# Patient Record
Sex: Male | Born: 1970 | Race: White | Hispanic: No | Marital: Single | Smoking: Never smoker
Health system: Southern US, Community
[De-identification: ages and names within clinical notes are randomized; demographics above are authoritative.]

## PROBLEM LIST (undated history)

## (undated) DIAGNOSIS — H9190 Unspecified hearing loss, unspecified ear: Secondary | ICD-10-CM

## (undated) HISTORY — PX: APPENDECTOMY: SHX54

## (undated) HISTORY — PX: OTHER SURGICAL HISTORY: SHX169

---

## 2000-12-17 ENCOUNTER — Emergency Department (HOSPITAL_COMMUNITY): Admission: EM | Admit: 2000-12-17 | Discharge: 2000-12-18 | Payer: Self-pay | Admitting: Internal Medicine

## 2018-10-30 ENCOUNTER — Emergency Department (HOSPITAL_COMMUNITY): Admission: EM | Admit: 2018-10-30 | Discharge: 2018-10-30 | Discharge status: Absent without leave | Payer: Self-pay

## 2018-10-30 ENCOUNTER — Encounter (HOSPITAL_COMMUNITY): Payer: Self-pay | Admitting: Nurse Practitioner

## 2018-10-30 ENCOUNTER — Emergency Department (HOSPITAL_COMMUNITY)
Admission: EM | Admit: 2018-10-30 | Discharge: 2018-10-30 | Disposition: A | Discharge status: Other Acute Inpatient Hospital | Payer: Self-pay | Attending: Emergency Medicine | Admitting: Emergency Medicine

## 2018-10-30 DIAGNOSIS — F332 Major depressive disorder, recurrent severe without psychotic features: Secondary | ICD-10-CM | POA: Insufficient documentation

## 2018-10-30 DIAGNOSIS — F17210 Nicotine dependence, cigarettes, uncomplicated: Secondary | ICD-10-CM | POA: Insufficient documentation

## 2018-10-30 DIAGNOSIS — R45851 Suicidal ideations: Secondary | ICD-10-CM | POA: Insufficient documentation

## 2018-10-30 LAB — CBC
HCT: 46.2 % (ref 39.0–52.0)
Hemoglobin: 15.9 g/dL (ref 13.0–17.0)
MCH: 32.3 pg (ref 26.0–34.0)
MCHC: 34.4 g/dL (ref 30.0–36.0)
MCV: 93.7 fL (ref 80.0–100.0)
Platelets: 300 10*3/uL (ref 150–400)
RBC: 4.93 MIL/uL (ref 4.22–5.81)
RDW: 11.7 % (ref 11.5–15.5)
WBC: 6.3 10*3/uL (ref 4.0–10.5)
nRBC: 0 % (ref 0.0–0.2)

## 2018-10-30 LAB — COMPREHENSIVE METABOLIC PANEL
ALT: 42 U/L (ref 0–44)
AST: 32 U/L (ref 15–41)
Albumin: 4.4 g/dL (ref 3.5–5.0)
Alkaline Phosphatase: 85 U/L (ref 38–126)
Anion gap: 8 (ref 5–15)
BUN: 10 mg/dL (ref 6–20)
CO2: 22 mmol/L (ref 22–32)
Calcium: 8.7 mg/dL — ABNORMAL LOW (ref 8.9–10.3)
Chloride: 106 mmol/L (ref 98–111)
Creatinine, Ser: 0.91 mg/dL (ref 0.61–1.24)
GFR calc Af Amer: >60 mL/min (ref >60)
GFR calc non Af Amer: >60 mL/min (ref >60)
Glucose, Bld: 110 mg/dL — ABNORMAL HIGH (ref 70–99)
Potassium: 4 mmol/L (ref 3.5–5.1)
Sodium: 136 mmol/L (ref 135–145)
Total Bilirubin: 0.7 mg/dL (ref 0.3–1.2)
Total Protein: 7.6 g/dL (ref 6.5–8.1)

## 2018-10-30 LAB — ETHANOL: Alcohol, Ethyl (B): 67 mg/dL — ABNORMAL HIGH (ref <10)

## 2018-10-30 LAB — RAPID URINE DRUG SCREEN, HOSP PERFORMED
Amphetamines: NOT DETECTED
Barbiturates: NOT DETECTED
Benzodiazepines: NOT DETECTED
Cocaine: NOT DETECTED
Opiates: NOT DETECTED
Tetrahydrocannabinol: NOT DETECTED

## 2018-10-30 LAB — SALICYLATE LEVEL: Salicylate Lvl: <7 mg/dL (ref 2.8–30.0)

## 2018-10-30 LAB — ACETAMINOPHEN LEVEL: Acetaminophen (Tylenol), Serum: <10 ug/mL — ABNORMAL LOW (ref 10–30)

## 2018-10-30 MED ORDER — ACETAMINOPHEN 325 MG PO TABS: 650.0000 mg | ORAL_TABLET | ORAL | Status: DC | PRN

## 2018-10-30 MED ORDER — ALUM & MAG HYDROXIDE-SIMETH 200-200-20 MG/5ML PO SUSP: 30.0000 mL | Freq: Four times a day (QID) | ORAL | Status: DC | PRN

## 2018-10-30 MED ORDER — NICOTINE 21 MG/24HR TD PT24: 21.0000 mg | MEDICATED_PATCH | Freq: Every day | TRANSDERMAL | Status: DC

## 2018-10-30 MED ORDER — ONDANSETRON HCL 4 MG PO TABS: 4.0000 mg | ORAL_TABLET | Freq: Three times a day (TID) | ORAL | Status: DC | PRN

## 2018-10-30 NOTE — ED Notes (Signed)
Report called to [facility] to Gentry Roch RN. [facility] [employer] called for transport.

## 2018-10-30 NOTE — BH Assessment (Addendum)
Assessment Note  Eric Nunez is an 47 y.o. male who presents to the ED voluntarily. Per chart review, pt was found by [employer] with a loaded gun and making threats to kill himself. TTS spoke with [employer] officers who remained at bedside and was advised the pt told officers he put the gun to his head several times and thought about pulling the trigger. Pt reported to TTS he was going to kill himself with the gun, but he could not go through with it. Pt denies any specific triggers causing him to feel suicidal. Pt states he lives with a roommate who is often loud and obnoxious in the home. Pt states he has felt suicidal for most of his life. Pt denies any recent changes or life stressors. Pt states he was sitting at home and thought to himself "today is a good day to die" prompting him to get his gun. Pt states he does not have an OPT provider because he was abruptly dropped by his current provider, [facility], 2 years ago without cause. Pt states he does not know why his previous OPT provider refused to continue treating him. Pt states he has not seen any providers in the past 2 years. TTS asked the pt if he has ever experienced trauma including physical or sexual abuse and pt stated "no, but I wish." TTS asked the pt to explain what he meant and he stated "it would be good if someone abused me because I deserve to feel pain." Pt denies HI, AVH and SA hx. Pt is unable to contract for safety at this time and will require inpt hospitalization.   Pt is recommended for inpt treatment per Donell Sievert, PA. ED staff has been advised. [facility] at capacity per [facility]. TTS to seek placement.   Diagnosis: MDD, recurrent, severe, w/o psychosis  Past Medical History: History reviewed. No pertinent past medical history.  History reviewed. No pertinent surgical history.  Family History: No family history on file.  Social History:  reports that he has been smoking. He does not have any smokeless tobacco history on  file. His alcohol and drug histories are not on file.  Additional Social History:  Alcohol / Drug Use Pain Medications: See MAR Prescriptions: See MAR Over the Counter: See MAR History of alcohol / drug use?: No history of alcohol / drug abuse  CIWA: CIWA-Ar BP: (!) 131/94 Pulse Rate: 69 COWS:    Allergies: No Known Allergies  Home Medications:  (Not in a hospital admission)  OB/GYN Status:  No LMP for male patient.  General Assessment Data Location of Assessment: [facility] TTS Assessment: In system Is this a Tele or Face-to-Face Assessment?: Face-to-Face Is this an Initial Assessment or a Re-assessment for this encounter?: Initial Assessment Patient Accompanied by:: Other([employer]) Language Other than English: No What gender do you identify as?: Male Marital status: Single Pregnancy Status: No Living Arrangements: Non-relatives/Friends Can pt return to current living arrangement?: Yes Admission Status: Voluntary Is patient capable of signing voluntary admission?: Yes Referral Source: Self/Family/Friend Insurance type: none     Crisis Care Plan Living Arrangements: Non-relatives/Friends Name of Psychiatrist: none Name of Therapist: none  Education Status Is patient currently in school?: No Is the patient employed, unemployed or receiving disability?: Employed  Risk to self with the past 6 months Suicidal Ideation: Yes-Currently Present Has patient been a risk to self within the past 6 months prior to admission? : Yes Suicidal Intent: Yes-Currently Present Has patient had any suicidal  intent within the past 6 months prior to admission? : Yes Is patient at risk for suicide?: Yes Suicidal Plan?: Yes-Currently Present Has patient had any suicidal plan within the past 6 months prior to admission? : Yes Specify Current Suicidal Plan: pt made threats to shoot himself  Access to Means: Yes Specify Access to Suicidal Means: pt has gun  What has been your use of drugs/alcohol  within the last 12 months?: denies use  Previous Attempts/Gestures: No Triggers for Past Attempts: None known Intentional Self Injurious Behavior: None Family Suicide History: No Recent stressful life event(s): Other (Comment)(pt denies stressors) Persecutory voices/beliefs?: No Depression: Yes Depression Symptoms: Despondent, Insomnia, Isolating, Fatigue, Loss of interest in usual pleasures, Feeling worthless/self pity Substance abuse history and/or treatment for substance abuse?: No Suicide prevention information given to non-admitted patients: Not applicable  Risk to Others within the past 6 months Homicidal Ideation: No Does patient have any lifetime risk of violence toward others beyond the six months prior to admission? : No Thoughts of Harm to Others: No Current Homicidal Intent: No Current Homicidal Plan: No Access to Homicidal Means: No History of harm to others?: No Assessment of Violence: None Noted Does patient have access to weapons?: Yes (Comment)(gun) Criminal Charges Pending?: No Does patient have a court date: No Is patient on probation?: No  Psychosis Hallucinations: None noted Delusions: None noted  Mental Status Report Appearance/Hygiene: In scrubs, Unremarkable Eye Contact: Good Motor Activity: Freedom of movement Speech: Logical/coherent Level of Consciousness: Alert Mood: Depressed, Despair Affect: Depressed, Flat Anxiety Level: None Thought Processes: Relevant, Coherent Judgement: Impaired Orientation: Person, Place, Time, Situation, Appropriate for developmental age Obsessive Compulsive Thoughts/Behaviors: None  Cognitive Functioning Concentration: Normal Memory: Remote Intact, Recent Intact Is patient IDD: No Insight: Poor Impulse Control: Poor Appetite: Good Have you had any weight changes? : No Change Sleep: Decreased Total Hours of Sleep: 4 Vegetative Symptoms: None  ADLScreening [facility] Assessment Services) Patient's cognitive  ability adequate to safely complete daily activities?: Yes Patient able to express need for assistance with ADLs?: Yes Independently performs ADLs?: Yes (appropriate for developmental age)  Prior Inpatient Therapy Prior Inpatient Therapy: Yes Prior Therapy Dates: unknown Prior Therapy Facilty/Provider(s): [facility]  Reason for Treatment: Depression  Prior Outpatient Therapy Prior Outpatient Therapy: Yes Prior Therapy Dates: 2017 Prior Therapy Facilty/Provider(s): [facility] Reason for Treatment: Depression Does patient have an ACCT team?: No Does patient have Intensive In-House Services?  : No Does patient have [facility] services? : No Does patient have P4CC services?: No  ADL Screening (condition at time of admission) Patient's cognitive ability adequate to safely complete daily activities?: Yes Is the patient deaf or have difficulty hearing?: No Does the patient have difficulty seeing, even when wearing glasses/contacts?: No Does the patient have difficulty concentrating, remembering, or making decisions?: No Patient able to express need for assistance with ADLs?: Yes Does the patient have difficulty dressing or bathing?: No Independently performs ADLs?: Yes (appropriate for developmental age) Does the patient have difficulty walking or climbing stairs?: No Weakness of Legs: None Weakness of Arms/Hands: None  Home Assistive Devices/Equipment Home Assistive Devices/Equipment: None    Abuse/Neglect Assessment (Assessment to be complete while patient is alone) Abuse/Neglect Assessment Can Be Completed: Yes Physical Abuse: Denies Verbal Abuse: Denies Sexual Abuse: Denies Exploitation of patient/patient's resources: Denies Self-Neglect: Denies     Merchant navy officer (For Healthcare) Does Patient Have a Medical Advance Directive?: No Would patient like information on creating a medical advance directive?: No -  Patient declined           Disposition: Pt is recommended for inpt treatment per Donell Sievert, PA. ED staff has been advised. [facility] at capacity per [facility]. TTS to seek placement.  Disposition Initial Assessment Completed for this Encounter: Yes Disposition of Patient: Admit Type of inpatient treatment program: Adult(per Donell Sievert, PA) Patient refused recommended treatment: No  On Site Evaluation by:   Reviewed with Physician:    Karolee Ohs 10/30/2018 2:31 AM

## 2018-10-30 NOTE — ED Triage Notes (Signed)
Pt is presented by [employer] voluntarily, reportedly called after he attempted to kill himself with a gun. Officers report finding a gun loaded with ammunition on pt's residency when they answered the call. Pt continues to report suicidal thoughts.

## 2018-10-30 NOTE — ED Notes (Signed)
Bed: WTR5 Expected date:  Expected time:  Means of arrival:  Comments: [employer] Mental Health/triage

## 2018-10-30 NOTE — Progress Notes (Addendum)
Patient is voluntarily and has been offered a bed at [facility].  Accepting Provider: Dr. Freda Jackson  RN Call for Report: [phone]   Patient agreed to go voluntarily to [facility]. Patient will need [place] transportation.   Enid Cutter, LCSW-A Clinical Social Worker [phone]

## 2018-10-30 NOTE — Progress Notes (Signed)
This patient continues to meet inpatient criteria. CSW faxed information to the following facilities:   [place] [facility] [place] [facility] [place] [facility]  Enid Cutter, [place] Clinical Social Worker [phone]

## 2018-10-30 NOTE — Progress Notes (Signed)
Pt is recommended for inpt treatment per Donell Sievert, PA. ED staff has been advised. [facility] at capacity per [facility]. TTS to seek placement.   Princess Bruins, MSW, LCSW Therapeutic Triage Specialist  [phone]

## 2018-10-30 NOTE — ED Provider Notes (Signed)
[facility]-EMERGENCY DEPT Provider Note   CSN: [id number] Arrival date & time: 10/30/18  0117     History   Chief Complaint Chief Complaint  Patient presents with  . Suicide Attempt    HPI Eric Nunez is a 47 y.o. male.  The history is provided by the patient.  He presents with complaints of suicidal thoughts.  He states he has had suicidal thoughts all his life.  He had thought he would shoot himself and he does have a gun with ammunition.  He denies prior suicide attempts.  He denies any homicidal thoughts.  He has had crying spells but no early morning awakening or anhedonia.  He denies hallucinations and denies other drug use.  History reviewed. No pertinent past medical history.  There are no active problems to display for this patient.   History reviewed. No pertinent surgical history.      Home Medications    Prior to Admission medications   Not on File    Family History No family history on file.  Social History Social History   Tobacco Use  . Smoking status: Current Some Day Smoker  Substance Use Topics  . Alcohol use: Not on file  . Drug use: Not on file     Allergies   Patient has no known allergies.   Review of Systems Review of Systems  All other systems reviewed and are negative.    Physical Exam Updated Vital Signs BP (!) 131/94 (BP Location: Right Arm)   Pulse 69   Temp 98 F (36.7 C) (Oral)   Resp 18   Ht 5\' 9"  (1.753 m)   Wt 99.8 kg   SpO2 98%   BMI 32.49 kg/m   Physical Exam  Nursing note and vitals reviewed.  47 year old male, resting comfortably and in no acute distress. Vital signs are significant for mild elevation of diastolic blood pressure. Oxygen saturation is 98%, which is normal. Head is normocephalic and atraumatic. PERRLA, EOMI. Oropharynx is clear. Neck is nontender and supple without adenopathy or JVD. Back is nontender and there is no CVA tenderness. Lungs are clear without rales,  wheezes, or rhonchi. Chest is nontender. Heart has regular rate and rhythm without murmur. Abdomen is soft, flat, nontender without masses or hepatosplenomegaly and peristalsis is normoactive. Extremities have no cyanosis or edema, full range of motion is present. Skin is warm and dry without rash. Neurologic: Depressed affect, normal speech, oriented x4, cranial nerves are intact, there are no motor or sensory deficits.  ED Treatments / Results  Labs (all labs ordered are listed, but only abnormal results are displayed) Labs Reviewed  COMPREHENSIVE METABOLIC PANEL - Abnormal; Notable for the following components:      Result Value   Glucose, Bld 110 (*)    Calcium 8.7 (*)    All other components within normal limits  ETHANOL - Abnormal; Notable for the following components:   Alcohol, Ethyl (B) 67 (*)    All other components within normal limits  ACETAMINOPHEN LEVEL - Abnormal; Notable for the following components:   Acetaminophen (Tylenol), Serum <10 (*)    All other components within normal limits  SALICYLATE LEVEL  CBC  RAPID URINE DRUG SCREEN, HOSP PERFORMED    Procedures Procedures   Medications Ordered in ED Medications  nicotine (NICODERM CQ - dosed in mg/24 hours) patch 21 mg (has no administration in time range)  alum & mag hydroxide-simeth (MAALOX/MYLANTA) 200-200-20 MG/5ML suspension 30 mL (has no administration  in time range)  ondansetron (ZOFRAN) tablet 4 mg (has no administration in time range)  acetaminophen (TYLENOL) tablet 650 mg (has no administration in time range)     Initial Impression / Assessment and Plan / ED Course  I have reviewed the triage vital signs and the nursing notes.  Pertinent labs & imaging results that were available during my care of the patient were reviewed by me and considered in my medical decision making (see chart for details).  Suicidal ideation.  He has no prior records in the [facility] health system.  TTS evaluation will be  requested.  Screening labs are obtained.  Screening labs are unremarkable.  TTS consultation is appreciated.  Patient meets inpatient criteria.  Awaiting appropriate placement.  Final Clinical Impressions(s) / ED Diagnoses   Final diagnoses:  Suicidal ideation  Severe episode of recurrent major depressive disorder, without psychotic features [facility])    ED Discharge Orders    None       Dione Booze, MD 10/30/18 [phone]

## 2018-10-30 NOTE — ED Notes (Signed)
Patient reports SI wit a plan to shoot himself. Patient denies HI/AVH. Patient contracts for safety while on unit. Plan of care discussed. Encouragement and support provided and safety maintain. Q 15 min safety checks in place and video monitoring.

## 2018-10-30 NOTE — ED Notes (Signed)
Bed: WBH41 Expected date:  Expected time:  Means of arrival:  Comments: Triage 5 

## 2018-10-30 NOTE — ED Notes (Signed)
Reports loaning a friend $1200 that did not actually belong to him, and now she has disappeared. It was to be used to fund some event and he was to provide entertainment. He is distraught with thoughts to kill himself. Reports a long-term mental health history. STays in bed calm and cooperative.

## 2020-03-22 DIAGNOSIS — E7800 Pure hypercholesterolemia, unspecified: Secondary | ICD-10-CM

## 2020-03-22 DIAGNOSIS — M25511 Pain in right shoulder: Secondary | ICD-10-CM

## 2020-03-22 DIAGNOSIS — R7989 Other specified abnormal findings of blood chemistry: Secondary | ICD-10-CM

## 2020-03-22 HISTORY — DX: Pure hypercholesterolemia, unspecified: E78.00

## 2020-03-22 HISTORY — DX: Pain in right shoulder: M25.511

## 2020-03-22 HISTORY — DX: Other specified abnormal findings of blood chemistry: R79.89

## 2020-03-26 ENCOUNTER — Ambulatory Visit: Payer: Self-pay | Admitting: Physician Assistant

## 2020-03-26 ENCOUNTER — Other Ambulatory Visit: Payer: Self-pay

## 2020-03-26 VITALS — BP 134/95 | HR 66 | Temp 98.6°F | Resp 18 | Ht 69.0 in | Wt 221.0 lb

## 2020-03-26 DIAGNOSIS — R5383 Other fatigue: Secondary | ICD-10-CM

## 2020-03-26 DIAGNOSIS — M542 Cervicalgia: Secondary | ICD-10-CM

## 2020-03-26 DIAGNOSIS — Z13220 Encounter for screening for lipoid disorders: Secondary | ICD-10-CM

## 2020-03-26 DIAGNOSIS — Z125 Encounter for screening for malignant neoplasm of prostate: Secondary | ICD-10-CM

## 2020-03-26 DIAGNOSIS — M25511 Pain in right shoulder: Secondary | ICD-10-CM

## 2020-03-26 MED ORDER — CYCLOBENZAPRINE HCL 10 MG PO TABS: 10.0000 mg | ORAL_TABLET | Freq: Three times a day (TID) | ORAL | 0 refills | Status: DC | PRN

## 2020-03-26 MED ORDER — IBUPROFEN 600 MG PO TABS: 600.0000 mg | ORAL_TABLET | Freq: Three times a day (TID) | ORAL | 0 refills | Status: DC | PRN

## 2020-03-26 NOTE — Progress Notes (Signed)
New Patient Office Visit  Subjective:  Patient ID: Eric Nunez, male    DOB: 03-15-1971  Age: 49 y.o. MRN: [id number]  CC:  Chief Complaint  Patient presents with  . Arm Pain    HPI Eric Nunez presents for neck pain  Reports that he has been having neck pain, right shoulder pain and right upper arm pain for the past year.  Reports that he is not sure if he "slept on his neck wrong", but states that it started without injury or trauma.  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  Reports that he has been feeling spasms and a knot near his right shoulder blade area.  Reports that he has tried stretching, heating pad, ice without much relief.  Reports he does not like taking over-the-counter pain medication.  Reports that he had a massage 2 weeks ago, feels that it did not offer relief.  Reports that he has difficulty sleeping due to the pain.  States that he does spend most of his time in one room due to his living conditions, his bed is where he watches TV and where he eats.  Reports that he does not feel he has good posture while he is watching TV.  Reports healthy diet, takes multivitamins, vitamin D, B12, eats a vegan diet, drinks 64 to 80 ounces of water a day.  Reports despite his healthy diet, he has been having extra fatigue, does endorse difficulty sleeping to the pain, increased stressors.  Has not tried anything for relief, does not like taking over-the-counter sleep medications.       History reviewed. No pertinent past medical history.  History reviewed. No pertinent surgical history.  History reviewed. No pertinent family history.  Social History   Socioeconomic History  . Marital status: Single    Spouse name: Not on file  . Number of children: Not on file  . Years of  education: Not on file  . Highest education level: Not on file  Occupational History  . Not on file  Tobacco Use  . Smoking status: Current Some Day Smoker  . Smokeless tobacco: Never Used  Substance and Sexual Activity  . Alcohol use: Not Currently  . Drug use: Never  . Sexual activity: Not on file  Other Topics Concern  . Not on file  Social History Narrative  . Not on file   Social Determinants of Health   Financial Resource Strain:   . Difficulty of Paying Living Expenses:   Food Insecurity:   . Worried About Programme researcher, broadcasting/film/video in the Last Year:   . Barista in the Last Year:   Transportation Needs:   . Freight forwarder (Medical):   Marland Kitchen Lack of Transportation (Non-Medical):   Physical Activity:   . Days of Exercise per Week:   . Minutes of Exercise per Session:   Stress:   . Feeling of Stress :   Social Connections:   . Frequency of Communication with Friends and Family:   . Frequency of Social Gatherings with Friends and Family:   . Attends Religious Services:   . Active Member of Clubs or Organizations:   . Attends Banker Meetings:   Marland Kitchen Marital Status:   Intimate Partner Violence:   .  Fear of Current or Ex-Partner:   . Emotionally Abused:   Marland Kitchen Physically Abused:   . Sexually Abused:     ROS Review of Systems  Constitutional: Positive for fatigue.  HENT: Negative.   Eyes: Negative.   Respiratory: Negative.   Cardiovascular: Negative.   Gastrointestinal: Negative.   Endocrine: Negative.   Genitourinary: Negative.   Musculoskeletal: Positive for arthralgias, back pain, myalgias and neck pain. Negative for neck stiffness.  Skin: Negative.   Allergic/Immunologic: Negative.   Hematological: Negative.   Psychiatric/Behavioral: Positive for sleep disturbance. Negative for suicidal ideas.    Objective:   Today's Vitals: Resp 18   Ht 5\' 9"  (1.753 m)   Wt 221 lb (100.2 kg)   BMI 32.64 kg/m   Physical Exam Vitals and nursing  note reviewed.  Constitutional:      Appearance: Normal appearance. He is obese.  HENT:     Head: Normocephalic and atraumatic.     Right Ear: External ear normal.     Left Ear: External ear normal.     Nose: Nose normal.     Mouth/Throat:     Mouth: Mucous membranes are moist.     Pharynx: Oropharynx is clear.  Eyes:     Extraocular Movements: Extraocular movements intact.     Conjunctiva/sclera: Conjunctivae normal.     Pupils: Pupils are equal, round, and reactive to light.  Cardiovascular:     Rate and Rhythm: Normal rate and regular rhythm.     Pulses: Normal pulses.     Heart sounds: Normal heart sounds.  Pulmonary:     Effort: Pulmonary effort is normal.     Breath sounds: Normal breath sounds.  Abdominal:     General: Abdomen is flat. Bowel sounds are normal.     Palpations: Abdomen is soft.  Musculoskeletal:     Cervical back: No rigidity. Pain with movement and muscular tenderness present. Decreased range of motion.     Thoracic back: Tenderness present.     Lumbar back: Tenderness present. Decreased range of motion. Positive right straight leg raise test and positive left straight leg raise test.     Comments: Pain elicited with ROM testing  Skin:    General: Skin is warm and dry.  Neurological:     General: No focal deficit present.     Mental Status: He is alert and oriented to person, place, and time.  Psychiatric:        Mood and Affect: Mood normal.        Behavior: Behavior normal.        Thought Content: Thought content normal.        Judgment: Judgment normal.     Assessment & Plan:   Problem List Items Addressed This Visit    None      Outpatient Encounter Medications as of 03/26/2020  Medication Sig  . acetaminophen (TYLENOL) 500 MG tablet Take 500 mg by mouth every 6 (six) hours as needed for mild pain or moderate pain.  Marland Kitchen ibuprofen (ADVIL,MOTRIN) 200 MG tablet Take 200 mg by mouth every 6 (six) hours as needed for mild pain or moderate pain.   . Multiple Vitamin (MULTIVITAMIN WITH MINERALS) TABS tablet Take 1 tablet by mouth daily.   No facility-administered encounter medications on file as of 03/26/2020.  1. Pain in joint of right shoulder Patient does not have insurance, gave patient education on [employer], patient is given appointment to see and establish primary care at patient care on  April 23, will hold off on ordering x-rays due to patient request.  Gave patient education on continuing heat, stretching exercises twice a day, work on better posture while watching television in bed. - ibuprofen (ADVIL) 600 MG tablet; Take 1 tablet (600 mg total) by mouth every 8 (eight) hours as needed.  Dispense: 60 tablet; Refill: 0 - cyclobenzaprine (FLEXERIL) 10 MG tablet; Take 1 tablet (10 mg total) by mouth 3 (three) times daily as needed for muscle spasms.  Dispense: 30 tablet; Refill: 0  2. Neck pain, acute  - ibuprofen (ADVIL) 600 MG tablet; Take 1 tablet (600 mg total) by mouth every 8 (eight) hours as needed.  Dispense: 60 tablet; Refill: 0 - cyclobenzaprine (FLEXERIL) 10 MG tablet; Take 1 tablet (10 mg total) by mouth 3 (three) times daily as needed for muscle spasms.  Dispense: 30 tablet; Refill: 0  3. Screening for prostate cancer  - PSA  4. Screening for lipid disorders  - CBC with Differential/Platelet - Comp. Metabolic Panel (12) - Lipid panel  5. Other fatigue Gave patient education on good sleep hygiene. - TSH  I have reviewed the patient's medical history (PMH, PSH, Social History, Family History, Medications, and allergies) , and have been updated if relevant. I spent 30 minutes reviewing chart and  face to face time with patient.     Follow-up: No follow-ups on file.   Kasandra Knudsen Mayers, PA-C

## 2020-03-26 NOTE — Patient Instructions (Addendum)
I sent ibuprofen and flexeril July your neck pain, you may use them every 8 hours, continue with the heating pad and do these neck exercises twice a day, heating yur neck and shoulder muscles before and after.  We are making you an appointment to establish primary care, they will help you with financial assistance and further evaluation of your neck pain   Neck Exercises Ask your health care provider which exercises are safe for you. Do exercises exactly as told by your health care provider and adjust them as directed. It is normal to feel mild stretching, pulling, tightness, or discomfort as you do these exercises. Stop right away if you feel sudden pain or your pain gets worse. Do not begin these exercises until told by your health care provider. Neck exercises can be important for many reasons. They can improve strength and maintain flexibility in your neck, which will help your upper back and prevent neck pain. Stretching exercises Rotation neck stretching  1. Sit in a chair or stand up. 2. Place your feet flat on the floor, shoulder width apart. 3. Slowly turn your head (rotate) to the right until a slight stretch is felt. Turn it all the way to the right so you can look over your right shoulder. Do not tilt or tip your head. 4. Hold this position for 10-30 seconds. 5. Slowly turn your head (rotate) to the left until a slight stretch is felt. Turn it all the way to the left so you can look over your left shoulder. Do not tilt or tip your head. 6. Hold this position for 10-30 seconds. Repeat __________ times. Complete this exercise __________ times a day. Neck retraction 1. Sit in a sturdy chair or stand up. 2. Look straight ahead. Do not bend your neck. 3. Use your fingers to push your chin backward (retraction). Do not bend your neck for this movement. Continue to face straight ahead. If you are doing the exercise properly, you will feel a slight sensation in your throat and a stretch at  the back of your neck. 4. Hold the stretch for 1-2 seconds. Repeat __________ times. Complete this exercise __________ times a day. Strengthening exercises Neck press 1. Lie on your back on a firm bed or on the floor with a pillow under your head. 2. Use your neck muscles to push your head down on the pillow and straighten your spine. 3. Hold the position as well as you can. Keep your head facing up (in a neutral position) and your chin tucked. 4. Slowly count to 5 while holding this position. Repeat __________ times. Complete this exercise __________ times a day. Isometrics These are exercises in which you strengthen the muscles in your neck while keeping your neck still (isometrics). 1. Sit in a supportive chair and place your hand on your forehead. 2. Keep your head and face facing straight ahead. Do not flex or extend your neck while doing isometrics. 3. Push forward with your head and neck while pushing back with your hand. Hold for 10 seconds. 4. Do the sequence again, this time putting your hand against the back of your head. Use your head and neck to push backward against the hand pressure. 5. Finally, do the same exercise on either side of your head, pushing sideways against the pressure of your hand. Repeat __________ times. Complete this exercise __________ times a day. Prone head lifts 1. Lie face-down (prone position), resting on your elbows so that your chest and upper back  are raised. 2. Start with your head facing downward, near your chest. Position your chin either on or near your chest. 3. Slowly lift your head upward. Lift until you are looking straight ahead. Then continue lifting your head as far back as you can comfortably stretch. 4. Hold your head up for 5 seconds. Then slowly lower it to your starting position. Repeat __________ times. Complete this exercise __________ times a day. Supine head lifts 1. Lie on your back (supine position), bending your knees to point  to the ceiling and keeping your feet flat on the floor. 2. Lift your head slowly off the floor, raising your chin toward your chest. 3. Hold for 5 seconds. Repeat __________ times. Complete this exercise __________ times a day. Scapular retraction 1. Stand with your arms at your sides. Look straight ahead. 2. Slowly pull both shoulders (scapulae) backward and downward (retraction) until you feel a stretch between your shoulder blades in your upper back. 3. Hold for 10-30 seconds. 4. Relax and repeat. Repeat __________ times. Complete this exercise __________ times a day. Contact a health care provider if:  Your neck pain or discomfort gets much worse when you do an exercise.  Your neck pain or discomfort does not improve within 2 hours after you exercise. If you have any of these problems, stop exercising right away. Do not do the exercises again unless your health care provider says that you can. Get help right away if:  You develop sudden, severe neck pain. If this happens, stop exercising right away. Do not do the exercises again unless your health care provider says that you can. This information is not intended to replace advice given to you by your health care provider. Make sure you discuss any questions you have with your health care provider. Document Revised: 10/06/2018 Document Reviewed: 10/06/2018 Elsevier Patient Education  2020 [employer].

## 2020-03-26 NOTE — Progress Notes (Signed)
Patient complains of dull constant neck pain for the past month. Pain radiates into shoulder and arm with a throbbing pain. ROM is present. [phone]

## 2020-03-28 ENCOUNTER — Other Ambulatory Visit: Payer: Self-pay | Admitting: Physician Assistant

## 2020-03-28 ENCOUNTER — Telehealth: Payer: Self-pay | Admitting: *Deleted

## 2020-03-28 DIAGNOSIS — E7849 Other hyperlipidemia: Secondary | ICD-10-CM

## 2020-03-28 DIAGNOSIS — R7989 Other specified abnormal findings of blood chemistry: Secondary | ICD-10-CM

## 2020-03-28 LAB — COMP. METABOLIC PANEL (12)
AST: 26 IU/L (ref 0–40)
Albumin/Globulin Ratio: 1.7 (ref 1.2–2.2)
Albumin: 4.4 g/dL (ref 4.0–5.0)
Alkaline Phosphatase: 83 IU/L (ref 39–117)
BUN/Creatinine Ratio: 6 — ABNORMAL LOW (ref 9–20)
BUN: 6 mg/dL (ref 6–24)
Bilirubin Total: 0.6 mg/dL (ref 0.0–1.2)
Calcium: 9.1 mg/dL (ref 8.7–10.2)
Chloride: 108 mmol/L — ABNORMAL HIGH (ref 96–106)
Creatinine, Ser: 0.98 mg/dL (ref 0.76–1.27)
GFR calc Af Amer: 103 mL/min/{1.73_m2} (ref >59)
GFR calc non Af Amer: 90 mL/min/{1.73_m2} (ref >59)
Globulin, Total: 2.6 g/dL (ref 1.5–4.5)
Glucose: 86 mg/dL (ref 65–99)
Potassium: 4.4 mmol/L (ref 3.5–5.2)
Sodium: 143 mmol/L (ref 134–144)
Total Protein: 7 g/dL (ref 6.0–8.5)

## 2020-03-28 LAB — LIPID PANEL
Chol/HDL Ratio: 5.3 ratio — ABNORMAL HIGH (ref 0.0–5.0)
Cholesterol, Total: 211 mg/dL — ABNORMAL HIGH (ref 100–199)
HDL: 40 mg/dL (ref >39)
LDL Chol Calc (NIH): 143 mg/dL — ABNORMAL HIGH (ref 0–99)
Triglycerides: 153 mg/dL — ABNORMAL HIGH (ref 0–149)
VLDL Cholesterol Cal: 28 mg/dL (ref 5–40)

## 2020-03-28 LAB — CBC WITH DIFFERENTIAL/PLATELET
Basophils Absolute: 0 10*3/uL (ref 0.0–0.2)
Basos: 1 %
EOS (ABSOLUTE): 0.2 10*3/uL (ref 0.0–0.4)
Eos: 3 %
Hematocrit: 47.7 % (ref 37.5–51.0)
Hemoglobin: 16.7 g/dL (ref 13.0–17.7)
Immature Grans (Abs): 0 10*3/uL (ref 0.0–0.1)
Immature Granulocytes: 1 %
Lymphocytes Absolute: 2.2 10*3/uL (ref 0.7–3.1)
Lymphs: 35 %
MCH: 33.1 pg — ABNORMAL HIGH (ref 26.6–33.0)
MCHC: 35 g/dL (ref 31.5–35.7)
MCV: 95 fL (ref 79–97)
Monocytes Absolute: 0.3 10*3/uL (ref 0.1–0.9)
Monocytes: 5 %
Neutrophils Absolute: 3.6 10*3/uL (ref 1.4–7.0)
Neutrophils: 55 %
Platelets: 320 10*3/uL (ref 150–450)
RBC: 5.05 x10E6/uL (ref 4.14–5.80)
RDW: 12 % (ref 11.6–15.4)
WBC: 6.3 10*3/uL (ref 3.4–10.8)

## 2020-03-28 LAB — TSH: TSH: 7.24 u[IU]/mL — ABNORMAL HIGH (ref 0.450–4.500)

## 2020-03-28 LAB — PSA: Prostate Specific Ag, Serum: 0.6 ng/mL (ref 0.0–4.0)

## 2020-03-28 MED ORDER — ATORVASTATIN CALCIUM 10 MG PO TABS: 10.0000 mg | ORAL_TABLET | Freq: Every day | ORAL | 0 refills | Status: DC

## 2020-03-28 NOTE — Telephone Encounter (Signed)
-----   Message from Roney Jaffe, [place] sent at 03/28/2020 10:21 AM EDT ----- Please call patient and let him know that his cholesterol was elevated, he does have a higher risk of stroke or heart attack in the next 10 years because of this and his cigarette smoking.  It is recommended that he start cholesterol medication, I am going to send a cholesterol medication to his pharmacy, it is an affordable medication.   this type of drug is usually highly effective to lower LDL cholesterol and is usually very well tolerated. However, statin-type drugs can potentially injure the liver. Blood tests will be required at regular intervals for the duration of therapy.  Damage to the liver, if detected early, can be reversed by stopping the drug.  Be alert for persistent nausea, abdominal pain, or yellow jaundice. Statin drugs may also cause skeletal muscle injury in rare cases. Be alert for pronounced persistent diffuse muscle pain and discontinue the drug immediately should such symptoms develop. Grapefruit juice may increase the blood levels and side effects of some HMG Co-A reductase inhibitors .  I understand that he follows a vegan diet, he does however need to pay attention to a lower cholesterol diet as well and of course highly recommend working on smoking cessation.He also had an abnormal thyroid reading, it suggests that he has hypothyroidism.  I recommend that he have this lab repeated prior to treatment.  I will put a lab in and he can come to [facility] next week and have this lab repeated or he can have this repeated when he has his primary care visit at patient care.  The 10-year ASCVD risk score Denman George DC Montez Hageman., et al., 2013) is: 11.2%   Values used to calculate the score:     Age: 49 years     Sex: Male     Is Non-Hispanic African American: No     Diabetic: No     Tobacco smoker: Yes     Systolic Blood Pressure: 134 mmHg     Is BP treated: No     HDL Cholesterol: 40 mg/dL     Total  Cholesterol: 211 mg/dL

## 2020-03-28 NOTE — Telephone Encounter (Signed)
MA unable to reach patient due to mobile being out of service and work phone no answer. Communication letter will be mailed to confirmed address.

## 2020-04-04 ENCOUNTER — Telehealth: Payer: Self-pay | Admitting: Family Medicine

## 2020-04-04 NOTE — Telephone Encounter (Signed)
Tried calling  Pt to remind of appointment but numbers is not in service

## 2020-04-10 ENCOUNTER — Telehealth: Payer: Self-pay | Admitting: Family Medicine

## 2020-04-10 NOTE — Telephone Encounter (Signed)
Tried calling Pt to reschedule appointment for Fri 4/23 number is not valid

## 2020-04-13 ENCOUNTER — Ambulatory Visit: Payer: Self-pay | Admitting: Family Medicine

## 2020-04-16 ENCOUNTER — Ambulatory Visit (INDEPENDENT_AMBULATORY_CARE_PROVIDER_SITE_OTHER): Payer: Self-pay | Admitting: Family Medicine

## 2020-04-16 ENCOUNTER — Encounter: Payer: Self-pay | Admitting: Family Medicine

## 2020-04-16 ENCOUNTER — Ambulatory Visit (HOSPITAL_COMMUNITY)
Admission: RE | Admit: 2020-04-16 | Discharge: 2020-04-16 | Disposition: A | Discharge status: Home / Self Care | Payer: Self-pay | Source: Ambulatory Visit | Attending: Family Medicine | Admitting: Family Medicine

## 2020-04-16 ENCOUNTER — Other Ambulatory Visit: Payer: Self-pay

## 2020-04-16 DIAGNOSIS — Z09 Encounter for follow-up examination after completed treatment for conditions other than malignant neoplasm: Secondary | ICD-10-CM | POA: Insufficient documentation

## 2020-04-16 DIAGNOSIS — M79644 Pain in right finger(s): Secondary | ICD-10-CM | POA: Insufficient documentation

## 2020-04-16 DIAGNOSIS — Z7689 Persons encountering health services in other specified circumstances: Secondary | ICD-10-CM

## 2020-04-16 DIAGNOSIS — M25511 Pain in right shoulder: Secondary | ICD-10-CM | POA: Insufficient documentation

## 2020-04-16 NOTE — Progress Notes (Signed)
Virtual Visit via Telephone Note  I connected with Eric Nunez on 04/16/20 at  3:15 PM EDT by telephone and verified that I am speaking with the correct person using two identifiers.   I discussed the limitations, risks, security and privacy concerns of performing an evaluation and management service by telephone and the availability of in person appointments. I also discussed with the patient that there may be a patient responsible charge related to this service. The patient expressed understanding and agreed to proceed.   History of Present Illness:  Past Surgical History:  Procedure Laterality Date  . APPENDECTOMY    . left knee repair      Social History   Socioeconomic History  . Marital status: Single    Spouse name: Not on file  . Number of children: Not on file  . Years of education: Not on file  . Highest education level: Not on file  Occupational History  . Not on file  Tobacco Use  . Smoking status: Never Smoker  . Smokeless tobacco: Never Used  Substance and Sexual Activity  . Alcohol use: Yes    Comment: rare  . Drug use: Not Currently  . Sexual activity: Not Currently  Other Topics Concern  . Not on file  Social History Narrative  . Not on file   Social Determinants of Health   Financial Resource Strain:   . Difficulty of Paying Living Expenses:   Food Insecurity:   . Worried About Programme researcher, broadcasting/film/video in the Last Year:   . Barista in the Last Year:   Transportation Needs:   . Freight forwarder (Medical):   Marland Kitchen Lack of Transportation (Non-Medical):   Physical Activity:   . Days of Exercise per Week:   . Minutes of Exercise per Session:   Stress:   . Feeling of Stress :   Social Connections:   . Frequency of Communication with Friends and Family:   . Frequency of Social Gatherings with Friends and Family:   . Attends Religious Services:   . Active Member of Clubs or Organizations:   . Attends Banker Meetings:    Marland Kitchen Marital Status:   Intimate Partner Violence:   . Fear of Current or Ex-Partner:   . Emotionally Abused:   Marland Kitchen Physically Abused:   . Sexually Abused:     Past Medical History:  Diagnosis Date  . Elevated cholesterol 03/2020  . Elevated TSH 03/2020  . Right shoulder pain 03/2020    No Known Allergies   Past Medical History:  Diagnosis Date  . Elevated cholesterol 03/2020  . Elevated TSH 03/2020  . Right shoulder pain 03/2020    Patient Active Problem List   Diagnosis Date Noted  . Right shoulder pain 04/16/2020  . Thumb pain, right 04/16/2020  . Follow up 04/16/2020   Current Status: This will be Mr.Mcconville's initial Telephone Virtual visit with me. He was not previously seeing a physician for his PCP needs. Since his last office visit, he is doing well with no complaints. He has c/o right shoulder and right thumb pain X 1 month now. He is not taking anything for pain at this time. He denies fevers, chills, fatigue, recent infections, weight loss, and night sweats. He has not had any headaches, visual changes, dizziness, and falls. No chest pain, heart palpitations, cough and shortness of breath reported. No reports of GI problems such as nausea, vomiting, diarrhea, and constipation. He has no reports of  blood in stools, dysuria and hematuria. No depression or anxiety, and denies suicidal ideations, homicidal ideations, or auditory hallucinations. He is taking all medications as prescribed.    Observations/Objective: Telephone Virtual Visit   Assessment and Plan:  1. Right shoulder pain, unspecified chronicity - DG Shoulder Right  2. Encounter to establish care  3. Thumb pain, right   No orders of the defined types were placed in this encounter.   Orders Placed This Encounter  Procedures  . DG Shoulder Right   Referral Orders  No referral(s) requested today    Kathe Becton,  MSN, FNP-BC [facility] [address] [place], [place] [address] [phone]- fax    Follow Up Instructions:  He will follow up in 1 month for office visit and repeat TSH level.    I discussed the assessment and treatment plan with the patient. The patient was provided an opportunity to ask questions and all were answered. The patient agreed with the plan and demonstrated an understanding of the instructions.   The patient was advised to call back or seek an in-person evaluation if the symptoms worsen or if the condition fails to improve as anticipated.  I provided 20 minutes of non-face-to-face time during this encounter.   Azzie Glatter, FNP

## 2020-04-16 NOTE — Progress Notes (Deleted)
[facility]    New Patient--Establish Care   Subjective:  Patient ID: Eric Nunez, male    DOB: 06/11/1971  Age: 50 y.o. MRN: [id number]  CC:  Chief Complaint  Patient presents with  . New Patient (Initial Visit)    Est care  . Pain    pain in the right arm, right thumb, right side of neck     HPI Eric Nunez is a 49 year old male who presents to Establish Care today.   No past medical history on file.  Past Surgical History:  Procedure Laterality Date  . APPENDECTOMY    . left knee repair     Current Status: This will be *** initial office visit with me. *** was previously seeing *** for *** PCP needs. Since *** last office visit, *** is doing well with no complaints.     He has c/o right shoulder and right thumb pain X 1 month now. He is not taking anything for pain at this time.     *** denies fevers, chills, fatigue, recent infections, weight loss, and night sweats. *** has not had any headaches, visual changes, dizziness, and falls. No chest pain, heart palpitations, cough and shortness of breath reported. No reports of GI problems such as nausea, vomiting, diarrhea, and constipation. *** has no reports of blood in stools, dysuria and hematuria. No depression or anxiety, and denies suicidal ideations, homicidal ideations, or auditory hallucinations. *** is *** all medications as prescribed.   Family History  Problem Relation Age of Onset  . Diabetes Mother   . Diabetes Father     Social History   Socioeconomic History  . Marital status: Single    Spouse name: Not on file  . Number of children: Not on file  . Years of education: Not on file  . Highest education level: Not on file  Occupational History  . Not on file  Tobacco Use  . Smoking status: Never Smoker  . Smokeless tobacco: Never Used  Substance and Sexual Activity  . Alcohol use: Yes    Comment: rare  . Drug use: Not  Currently  . Sexual activity: Not Currently  Other Topics Concern  . Not on file  Social History Narrative  . Not on file   Social Determinants of Health   Financial Resource Strain:   . Difficulty of Paying Living Expenses:   Food Insecurity:   . Worried About Programme researcher, broadcasting/film/video in the Last Year:   . Barista in the Last Year:   Transportation Needs:   . Freight forwarder (Medical):   Marland Kitchen Lack of Transportation (Non-Medical):   Physical Activity:   . Days of Exercise per Week:   . Minutes of Exercise per Session:   Stress:   . Feeling of Stress :   Social Connections:   . Frequency of Communication with Friends and Family:   . Frequency of Social Gatherings with Friends and Family:   . Attends Religious Services:   . Active Member of Clubs or Organizations:   . Attends Banker Meetings:   Marland Kitchen Marital Status:   Intimate Partner Violence:   . Fear of Current or Ex-Partner:   . Emotionally Abused:   Marland Kitchen Physically Abused:   . Sexually Abused:     Outpatient Medications Prior to Visit  Medication Sig Dispense Refill  . ibuprofen (ADVIL) 600 MG tablet Take 1 tablet (600 mg  total) by mouth every 8 (eight) hours as needed. 60 tablet 0  . Multiple Vitamin (MULTIVITAMIN WITH MINERALS) TABS tablet Take 1 tablet by mouth daily.    Marland Kitchen acetaminophen (TYLENOL) 500 MG tablet Take 500 mg by mouth every 6 (six) hours as needed for mild pain or moderate pain.    Marland Kitchen atorvastatin (LIPITOR) 10 MG tablet Take 1 tablet (10 mg total) by mouth daily. (Patient not taking: Reported on 04/16/2020) 30 tablet 0  . cyclobenzaprine (FLEXERIL) 10 MG tablet Take 1 tablet (10 mg total) by mouth 3 (three) times daily as needed for muscle spasms. (Patient not taking: Reported on 04/16/2020) 30 tablet 0   No facility-administered medications prior to visit.    No Known Allergies  ROS Review of Systems    Objective:    Physical Exam  There were no vitals taken for this visit. Wt  Readings from Last 3 Encounters:  03/26/20 221 lb (100.2 kg)  10/30/18 220 lb (99.8 kg)     Health Maintenance Due  Topic Date Due  . HIV Screening  Never done  . COVID-19 Vaccine (1) Never done  . COLONOSCOPY  Never done    There are no preventive care reminders to display for this patient.  Lab Results  Component Value Date   TSH 7.240 (H) 03/26/2020   Lab Results  Component Value Date   WBC 6.3 03/26/2020   HGB 16.7 03/26/2020   HCT 47.7 03/26/2020   MCV 95 03/26/2020   PLT 320 03/26/2020   Lab Results  Component Value Date   NA 143 03/26/2020   K 4.4 03/26/2020   CO2 22 10/30/2018   GLUCOSE 86 03/26/2020   BUN 6 03/26/2020   CREATININE 0.98 03/26/2020   BILITOT 0.6 03/26/2020   ALKPHOS 83 03/26/2020   AST 26 03/26/2020   ALT 42 10/30/2018   PROT 7.0 03/26/2020   ALBUMIN 4.4 03/26/2020   CALCIUM 9.1 03/26/2020   ANIONGAP 8 10/30/2018   Lab Results  Component Value Date   CHOL 211 (H) 03/26/2020   Lab Results  Component Value Date   HDL 40 03/26/2020   Lab Results  Component Value Date   LDLCALC 143 (H) 03/26/2020   Lab Results  Component Value Date   TRIG 153 (H) 03/26/2020   Lab Results  Component Value Date   CHOLHDL 5.3 (H) 03/26/2020   No results found for: HGBA1C    Assessment & Plan:   Problem List Items Addressed This Visit    None      No orders of the defined types were placed in this encounter.   Follow-up: No follow-ups on file.    Azzie Glatter, FNP

## 2020-04-24 ENCOUNTER — Other Ambulatory Visit: Payer: Self-pay

## 2020-04-24 ENCOUNTER — Ambulatory Visit: Payer: Self-pay | Attending: Family Medicine

## 2020-04-30 ENCOUNTER — Telehealth: Payer: Self-pay | Admitting: Family Medicine

## 2020-04-30 NOTE — Telephone Encounter (Signed)
Message left for patient to contact office to review results of recent scan.

## 2020-05-14 ENCOUNTER — Ambulatory Visit: Payer: Self-pay | Admitting: Family Medicine

## 2020-05-25 ENCOUNTER — Ambulatory Visit: Payer: Self-pay | Admitting: Family Medicine

## 2020-06-20 ENCOUNTER — Ambulatory Visit: Payer: Self-pay | Attending: Family Medicine | Admitting: Nurse Practitioner

## 2020-06-20 ENCOUNTER — Encounter: Payer: Self-pay | Admitting: Nurse Practitioner

## 2020-06-20 DIAGNOSIS — Z7689 Persons encountering health services in other specified circumstances: Secondary | ICD-10-CM

## 2020-06-20 DIAGNOSIS — R7989 Other specified abnormal findings of blood chemistry: Secondary | ICD-10-CM

## 2020-06-20 DIAGNOSIS — M25511 Pain in right shoulder: Secondary | ICD-10-CM

## 2020-06-20 MED ORDER — NAPROXEN 500 MG PO TABS: 500.0000 mg | ORAL_TABLET | Freq: Two times a day (BID) | ORAL | 1 refills | Status: AC

## 2020-06-20 NOTE — Progress Notes (Signed)
Virtual Visit via Telephone Note Due to national recommendations of social distancing due to COVID 19, telehealth visit is felt to be most appropriate for this patient at this time.  I discussed the limitations, risks, security and privacy concerns of performing an evaluation and management service by telephone and the availability of in person appointments. I also discussed with the patient that there may be a patient responsible charge related to this service. The patient expressed understanding and agreed to proceed.    I connected with Aristidis Talerico Guilmette on 06/20/20  at   1:30 PM EDT  EDT by telephone and verified that I am speaking with the correct person using two identifiers.   Consent I discussed the limitations, risks, security and privacy concerns of performing an evaluation and management service by telephone and the availability of in person appointments. I also discussed with the patient that there may be a patient responsible charge related to this service. The patient expressed understanding and agreed to proceed.   Location of Patient: Private residence   Location of Provider: [facility]    Persons participating in Telemedicine visit: Bertram Denver FNP-BC YY [place] CMA Donis Pinder Fries    History of Present Illness: Telemedicine visit for: Establish Care  Arthralgia Neck pain, Right shoulder and right thumb pain. Chronic. He is not sure if his shoulder pain is related to heavy lifting at his previous job.  Denies any injury or trauma. Has been prescribed advil and flexeril.  There is numbness when he sleeps on the right arm. He has taken his mother's tramadol and his father's hydrocodone. States tramadol seems to help with his pain. However will prescribe NSAID today.   The 10-year ASCVD risk score Denman George DC Montez Hageman., et al., 2013) is: 5.1%   Values used to calculate the score:     Age: 49 years     Sex: Male     Is Non-Hispanic  African American: No     Diabetic: No     Tobacco smoker: No     Systolic Blood Pressure: 134 mmHg     Is BP treated: No     HDL Cholesterol: 40 mg/dL     Total Cholesterol: 211 mg/dL  Elevated Thyroid Level Believes his thyroid levels are related to his diet.    Past Medical History:  Diagnosis Date  . Elevated cholesterol 03/2020  . Elevated TSH 03/2020  . Right shoulder pain 03/2020    Past Surgical History:  Procedure Laterality Date  . APPENDECTOMY    . left knee repair      Family History  Problem Relation Age of Onset  . Diabetes Mother   . Diabetes Father     Social History   Socioeconomic History  . Marital status: Single    Spouse name: Not on file  . Number of children: Not on file  . Years of education: Not on file  . Highest education level: Not on file  Occupational History  . Not on file  Tobacco Use  . Smoking status: Never Smoker  . Smokeless tobacco: Never Used  Vaping Use  . Vaping Use: Never used  Substance and Sexual Activity  . Alcohol use: Yes    Comment: rare  . Drug use: Not Currently  . Sexual activity: Not Currently  Other Topics Concern  . Not on file  Social History Narrative  . Not on file   Social Determinants of Health   Financial Resource Strain:   .  Difficulty of Paying Living Expenses:   Food Insecurity:   . Worried About Programme researcher, broadcasting/film/video in the Last Year:   . Barista in the Last Year:   Transportation Needs:   . Freight forwarder (Medical):   Marland Kitchen Lack of Transportation (Non-Medical):   Physical Activity:   . Days of Exercise per Week:   . Minutes of Exercise per Session:   Stress:   . Feeling of Stress :   Social Connections:   . Frequency of Communication with Friends and Family:   . Frequency of Social Gatherings with Friends and Family:   . Attends Religious Services:   . Active Member of Clubs or Organizations:   . Attends Banker Meetings:   Marland Kitchen Marital Status:       Observations/Objective: Awake, alert and oriented x 3   Review of Systems  Constitutional: Negative for fever, malaise/fatigue and weight loss.  HENT: Negative.  Negative for nosebleeds.   Eyes: Negative.  Negative for blurred vision, double vision and photophobia.  Respiratory: Negative.  Negative for cough and shortness of breath.   Cardiovascular: Negative.  Negative for chest pain, palpitations and leg swelling.  Gastrointestinal: Negative.  Negative for heartburn, nausea and vomiting.  Musculoskeletal: Positive for joint pain. Negative for back pain and myalgias.  Neurological: Negative.  Negative for dizziness, focal weakness, seizures and headaches.  Psychiatric/Behavioral: Negative.  Negative for suicidal ideas.    Assessment and Plan: Darrall was seen today for establish care and shoulder pain.  Diagnoses and all orders for this visit:  Encounter to establish care  Right shoulder pain, unspecified chronicity -     naproxen (NAPROSYN) 500 MG tablet; Take 1 tablet (500 mg total) by mouth 2 (two) times daily with a meal. -     Ambulatory referral to Physical Medicine Rehab May alternate with heat and ice application for pain relief. May also alternate with acetaminophen as prescribed pain relief. Other alternatives include massage, acupuncture and water aerobics.  You must stay active and avoid a sedentary lifestyle.  Elevated TSH -     Thyroid Panel With TSH; Future     Follow Up Instructions Return in about 6 weeks (around 08/01/2020).     I discussed the assessment and treatment plan with the patient. The patient was provided an opportunity to ask questions and all were answered. The patient agreed with the plan and demonstrated an understanding of the instructions.   The patient was advised to call back or seek an in-person evaluation if the symptoms worsen or if the condition fails to improve as anticipated.  I provided 20 minutes of non-face-to-face time during  this encounter including median intraservice time, reviewing previous notes, labs, imaging, medications and explaining diagnosis and management.  Claiborne Rigg, FNP-BC

## 2020-06-29 ENCOUNTER — Encounter: Payer: Self-pay | Admitting: Physical Medicine and Rehabilitation

## 2020-07-05 ENCOUNTER — Telehealth: Payer: Self-pay

## 2020-07-05 NOTE — Telephone Encounter (Signed)
-----   Message from Claiborne Rigg, NP sent at 07/03/2020 10:58 AM EDT ----- Please call patient and let him know he has appt. Thanks!!! ----- Message ----- From: Delle Reining L Sent: 06/29/2020   1:04 PM EDT To: Claiborne Rigg, NP

## 2020-07-05 NOTE — Telephone Encounter (Signed)
Attempt to reach patient to inform his appt. With Physical medicine rehab is on 08/10/2020 is a 8:20 A.M.  No answer and LVM.

## 2020-08-10 ENCOUNTER — Encounter
Payer: No Typology Code available for payment source | Attending: Physical Medicine and Rehabilitation | Admitting: Physical Medicine and Rehabilitation

## 2020-08-10 ENCOUNTER — Other Ambulatory Visit: Payer: Self-pay

## 2020-08-10 ENCOUNTER — Encounter: Payer: Self-pay | Admitting: Physical Medicine and Rehabilitation

## 2020-08-10 VITALS — BP 122/87 | HR 69 | Temp 98.8°F | Ht 69.0 in | Wt 226.0 lb

## 2020-08-10 DIAGNOSIS — G5631 Lesion of radial nerve, right upper limb: Secondary | ICD-10-CM | POA: Insufficient documentation

## 2020-08-10 DIAGNOSIS — M62830 Muscle spasm of back: Secondary | ICD-10-CM | POA: Insufficient documentation

## 2020-08-10 NOTE — Progress Notes (Signed)
Subjective:    Patient ID: Eric Nunez, male    DOB: 03/20/1971, 49 y.o.   MRN: [id number]  HPI  Eric Nunez is a 49 year old man who presents to establish care for pain. He pulled a muscle in January moving a box. He had difficulty moving his arm. He got XR in April. Right shoulder XR shows mild AC and glenohumeral joint arthritis.  He had numbness in his hand and his arm felt it was on fire. He had pain there after his vaccination, but the pain started before his vaccination. His current pain is gone. When he sleeps on that side it goes numb. No issues on the other side.   He tries to follow an anti-inflammatory diet. Currently has no pain. Only has numbness with those positions, not at rest.   Also complaints of lower back support in left lower back.   He also complains of some 5th metatarsal irritation.   Pain Inventory Average Pain 0 Pain Right Now 0 My pain is numbness  In the last 24 hours, has pain interfered with the following? General activity 0 Relation with others 0 Enjoyment of life 0 What TIME of day is your pain at its worst? morning  Sleep (in general) Poor  Pain is worse with: pressure Pain improves with: rest and time Relief from Meds: no pain meds  walk without assistance how many minutes can you walk? unlimited ability to climb steps?  yes do you drive?  yes Do you have any goals in this area?  no  employed # of hrs/week 26 Do you have any goals in this area?  yes  numbness tingling depression anxiety suicidal thoughts - no  Active plans   new visit  Primary care Dr. Bertram Denver    Family History  Problem Relation Age of Onset  . Diabetes Mother   . Diabetes Father    Social History   Socioeconomic History  . Marital status: Single    Spouse name: Not on file  . Number of children: Not on file  . Years of education: Not on file  . Highest education level: Not on file  Occupational History  . Not on file    Tobacco Use  . Smoking status: Never Smoker  . Smokeless tobacco: Never Used  Vaping Use  . Vaping Use: Never used  Substance and Sexual Activity  . Alcohol use: Yes    Comment: rare  . Drug use: Not Currently  . Sexual activity: Not Currently  Other Topics Concern  . Not on file  Social History Narrative  . Not on file   Social Determinants of Health   Financial Resource Strain:   . Difficulty of Paying Living Expenses: Not on file  Food Insecurity:   . Worried About Programme researcher, broadcasting/film/video in the Last Year: Not on file  . Ran Out of Food in the Last Year: Not on file  Transportation Needs:   . Lack of Transportation (Medical): Not on file  . Lack of Transportation (Non-Medical): Not on file  Physical Activity:   . Days of Exercise per Week: Not on file  . Minutes of Exercise per Session: Not on file  Stress:   . Feeling of Stress : Not on file  Social Connections:   . Frequency of Communication with Friends and Family: Not on file  . Frequency of Social Gatherings with Friends and Family: Not on file  . Attends Religious Services: Not on file  .  Active Member of Clubs or Organizations: Not on file  . Attends Banker Meetings: Not on file  . Marital Status: Not on file   Past Surgical History:  Procedure Laterality Date  . APPENDECTOMY    . left knee repair     Past Medical History:  Diagnosis Date  . Elevated cholesterol 03/2020  . Elevated TSH 03/2020  . Right shoulder pain 03/2020   Temp 98.8 F (37.1 C)   Ht 5\' 9"  (1.753 m)   Wt 226 lb (102.5 kg)   BMI 33.37 kg/m   Opioid Risk Score:   Fall Risk Score:  `1  Depression screen PHQ 2/9  Depression screen [facility] 2/9 08/10/2020 03/26/2020  Decreased Interest 3 0  Down, Depressed, Hopeless 3 0  PHQ - 2 Score 6 0  Altered sleeping 3 -  Tired, decreased energy 2 -  Change in appetite 2 -  Feeling bad or failure about yourself  3 -  Trouble concentrating 1 -  Moving slowly or fidgety/restless 0 -   Suicidal thoughts 3 -  PHQ-9 Score 20 -  Difficult doing work/chores Extremely dIfficult -    Review of Systems  Constitutional: Positive for unexpected weight change.  HENT: Negative.   Eyes: Negative.   Respiratory: Negative.   Cardiovascular: Negative.   Gastrointestinal: Negative.   Endocrine: Negative.   Genitourinary: Negative.   Musculoskeletal: Positive for back pain.  Skin: Negative.   Allergic/Immunologic: Negative.   Neurological: Positive for numbness.       Tingling  Hematological: Negative.   Psychiatric/Behavioral: Positive for dysphoric mood and suicidal ideas. The patient is nervous/anxious.   All other systems reviewed and are negative.      Objective:   Physical Exam Gen: no distress, normal appearing HEENT: oral mucosa pink and moist, NCAT Cardio: Reg rate Chest: normal effort, normal rate of breathing Abd: soft, non-distended Ext: no edema Skin: intact Neuro: Alert and oriented x3.  Musculoskeletal: 5/5 strength throughout upper extremities. Negative Neer's test. Sensation intact. Left L5 paraspinal muscle tightness Psych: pleasant, normal affect    Assessment & Plan:  Mr. 05/26/2020 is a 49 year old man who presents to establish care for 1) right sided radial neuropathy and 2) left L5 paraspinal muscle spasm   -Pain is only present with positional changes or compression on that shoulder.   -Continue anti-inflammatory diet.  -Advised that he can continue his exercise program.   -Physical therapy for postural correction, core and paraspinal muscle strengthening, HEP  -Continue drinking water.   -Continue walking 5 miles per day.  -Provided ortho-info exercises for him to do until he can get scheduled with physical therapy.   All questions answered. RTC in 4 weeks.

## 2020-08-24 ENCOUNTER — Ambulatory Visit: Payer: Self-pay | Attending: Physical Medicine and Rehabilitation | Admitting: Physical Therapy

## 2020-08-24 ENCOUNTER — Other Ambulatory Visit: Payer: Self-pay

## 2020-08-24 ENCOUNTER — Encounter: Payer: Self-pay | Admitting: Physical Therapy

## 2020-08-24 DIAGNOSIS — M5412 Radiculopathy, cervical region: Secondary | ICD-10-CM | POA: Insufficient documentation

## 2020-08-24 DIAGNOSIS — M545 Low back pain, unspecified: Secondary | ICD-10-CM

## 2020-08-24 DIAGNOSIS — G8929 Other chronic pain: Secondary | ICD-10-CM | POA: Insufficient documentation

## 2020-08-24 DIAGNOSIS — R293 Abnormal posture: Secondary | ICD-10-CM | POA: Insufficient documentation

## 2020-08-24 NOTE — Patient Instructions (Signed)
Access Code: [email or web address] by: Onalee Hua CarrollExercises  Seated Hamstring Stretch - 1 x daily - 7 x weekly - 3 sets - 10 reps  Seated Upper Trapezius Stretch - 1 x daily - 7 x weekly - 3 sets - 10 reps  Gentle Levator Scapulae Stretch - 1 x daily - 7 x weekly - 3 sets - 10 reps  Standing Upper Trapezius Mobilization with Small Ball - 1 x daily - 7 x weekly - 2 min hold  Standing Glute Med Mobilization with Small Ball on Wall - 1 x daily - 7 x weekly - 2 min hold

## 2020-08-24 NOTE — Therapy (Signed)
[facility] [address] [place], [place], [address] Phone: [phone]   Fax:  [phone]  Physical Therapy Evaluation  Patient Details  Name: Eric Nunez MRN: [id number] Date of Birth: 12-22-1971 Referring Provider (PT): Dr Florene Route   Encounter Date: 08/24/2020   PT End of Session - 08/24/20 0952    Visit Number 1    Number of Visits 12    Date for PT Re-Evaluation 10/05/20    Authorization Type CAFA    PT Start Time 1015    PT Stop Time 1058    PT Time Calculation (min) 43 min    Activity Tolerance Patient tolerated treatment well    Behavior During Therapy [facility] for tasks assessed/performed           Past Medical History:  Diagnosis Date  . Elevated cholesterol 03/2020  . Elevated TSH 03/2020  . Right shoulder pain 03/2020    Past Surgical History:  Procedure Laterality Date  . APPENDECTOMY    . left knee repair      There were no vitals filed for this visit.    Subjective Assessment - 08/24/20 0940    Subjective On April 30th the patient began having numbness and pain down his right shoulder, lateral deltoid and into his thumb and first finger. He has a 12 year history of low back pain. The pain is in his left paraspinal. He also has some weakness and pain in his hands.    Pertinent History left knee debirdement in 1986    Limitations Lifting    How long can you sit comfortably? depends on position but can make his back tight    Diagnostic tests No x-rays in the chart but patient reports he has had x-rays and they are clear    Patient Stated Goals continue to be able to juggle    Currently in Pain? Yes    Pain Score 8    can be a high level of pain per patient but not constant   Pain Location Shoulder    Pain Orientation Right    Pain Descriptors / Indicators Aching    Pain Radiating Towards follows a radial nerve distrabutuion    Pain Onset More than a month ago    Pain Frequency  Intermittent    Aggravating Factors  lying on the side    Pain Relieving Factors comes and goes    Effect of Pain on Daily Activities numbness into his arm with activity              [facility] PT Assessment - 08/24/20 0001      Assessment   Medical Diagnosis Low Back Pain/ Righ UE radiculopathy     Referring Provider (PT) Dr Florene Route    Onset Date/Surgical Date 04/20/20    Hand Dominance Right;Left   can use both hands    Prior Therapy Nothing recent       Precautions   Precautions None      Restrictions   Weight Bearing Restrictions No      Balance Screen   Has the patient fallen in the past 6 months Yes    How many times? 1   fell off a step ladder    Has the patient had a decrease in activity level because of a fear of falling?  No    Is the patient reluctant to leave their home because of a fear of falling?  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     Leisure like to work out but hasn't been able to       [employer]   Overall Cognitive Status Within Functional Limits for tasks assessed    Attention Focused    Focused Attention Appears intact    Memory Appears intact    Awareness Appears intact    Problem Solving Appears intact      Observation/Other Assessments   Focus on Therapeutic Outcomes (FOTO)  wrong body part       Sensation   Light Touch Appears Intact      Coordination   Gross Motor Movements are Fluid and Coordinated Yes    Fine Motor Movements are Fluid and Coordinated Yes      Posture/Postural Control   Posture Comments rounded shoulders/ forward head ; increased thoracic kyphosis      ROM / Strength   AROM / PROM / Strength AROM;PROM;Strength      AROM   Overall AROM Comments full active motion of the     AROM Assessment Site Cervical;Lumbar    Cervical - Left Side Bend 70     Cervical - Right Rotation 50    Lumbar Flexion linited 25%     Lumbar - Right Rotation WNL     Lumbar - Left Rotation WNL       PROM   Overall PROM Comments full PROM of the shoulders       Strength   Overall Strength Comments 5/5 gross UE and LE strength       Palpation   Palpation comment spasming in left lumbar paraspinal; significant tightness in bilateral upper traps and into bilateral periscapular area       Special Tests    Special Tests Thoracic Outlet Syndrome    Other special tests roos (+) right; sprulings (+) right; Hawkins (-)     Thoracic Outlet Syndrome  --   roos     Ambulation/Gait   Gait Comments mild decrease in bilateral hip flexion with gait                      Objective measurements completed on examination: See above findings.       [facility] Adult PT Treatment/Exercise - 08/24/20 0001      Exercises   Exercises Lumbar;Neck      Lumbar Exercises: Stretches   Active Hamstring Stretch 3 reps;20 seconds    Active Hamstring Stretch Limitations seated with cuing     Other Lumbar Stretch Exercise tennis ball trigger point relef to upper trap and left lumbar spine       Neck Exercises: Stretches   Upper Trapezius Stretch 2 reps;20 seconds;Left    Levator Stretch 2 reps;20 seconds;Left                  PT Education - 08/24/20 1218    Education Details HEP and symptom mangement    Person(s) Educated Patient    Methods Explanation;Demonstration;Tactile cues;Verbal cues    Comprehension Verbalized understanding;Verbal cues required;Returned demonstration;Tactile cues required            PT Short Term Goals - 08/24/20 1138      PT SHORT TERM GOAL #1   Title Patient will demonstrate full lumbar flexion without pain  Time 3    Period Weeks    Status New    Target Date 09/14/20      PT SHORT TERM GOAL #2   Title Patient will reports a 50% reduction in numbness into the hand    Time 3    Period Weeks    Status New     Target Date 09/14/20      PT SHORT TERM GOAL #3   Title Patient will be indepdnent with basic stretching and strengthening program    Time 3    Period Weeks    Status New    Target Date 09/14/20             PT Long Term Goals - 08/24/20 1150      PT LONG TERM GOAL #1   Title Patient will sleep on his tright side without numbness    Time 6    Period Weeks    Status New    Target Date 10/05/20      PT LONG TERM GOAL #2   Title Patient will return to the gym without increased back pain or numbness    Time 6    Period Weeks    Status New    Target Date 10/05/20                  Plan - 08/24/20 1153    Clinical Impression Statement Patient is a 49 year old male with a 6 month history of pain and numbness following a radial nerve distrabution. He also has spasming in his left lumbar spine that has been on and off for several year. He has mild limitations in cervical rotation and flexion. He has signifcant rounding of his shoulders and an increased thoraic kyphosis. His posture is likley contributing to both problems. He hada positive roos test on the right. He would benefit from skilled therapy to improve posture, decrease radicular symptoms, and improve his ability to exercises.    Personal Factors and Comorbidities Profession;Fitness   unable to go to the gym   Examination-Activity Limitations Reach Overhead;Bend;Carry;Lift    Examination-Participation Restrictions Cleaning;Community Activity;Occupation;Laundry    Stability/Clinical Decision Making Evolving/Moderate complexity    Clinical Decision Making Moderate    Rehab Potential Good    PT Frequency 1x / week    PT Duration 6 weeks    PT Treatment/Interventions ADLs/Self Care Home Management;Electrical Stimulation;Cryotherapy;Iontophoresis 4mg /ml Dexamethasone;Moist Heat;DME Instruction;Functional mobility training;Therapeutic activities;Therapeutic exercise;Neuromuscular re-education;Patient/family education;Manual  techniques;Passive range of motion;Dry needling;Taping    PT Next Visit Plan manual cervical traction, dry needling to parapainal, consider nerve glides ( radial) if not irritiating, the most improtant thing for him will be a postural corrrection program. Check to see if cert is signed. Initially had no script for cervical radiculoathy.           Patient will benefit from skilled therapeutic intervention in order to improve the following deficits and impairments:  Decreased range of motion, Increased fascial restricitons, Impaired UE functional use, Decreased endurance, Decreased activity tolerance, Decreased mobility, Impaired sensation, Postural dysfunction  Visit Diagnosis: Chronic left-sided low back pain without sciatica  Radiculopathy, cervical region  Abnormal posture     Problem List Patient Active Problem List   Diagnosis Date Noted  . Right shoulder pain 04/16/2020  . Thumb pain, right 04/16/2020  . Follow up 04/16/2020    04/18/2020  PT DPT  08/24/2020, 12:20 PM  [facility] [address] [place], [place],  [id number] Phone: [phone]   Fax:  [phone]  Name: Eric LenteJoseph Arthur Nunez MRN: [id number] Date of Birth: [birth year]

## 2020-08-29 ENCOUNTER — Ambulatory Visit: Payer: Self-pay | Admitting: Physical Therapy

## 2020-08-29 ENCOUNTER — Encounter: Payer: Self-pay | Admitting: Physical Therapy

## 2020-08-29 ENCOUNTER — Other Ambulatory Visit: Payer: Self-pay

## 2020-08-29 DIAGNOSIS — M5412 Radiculopathy, cervical region: Secondary | ICD-10-CM

## 2020-08-29 DIAGNOSIS — R293 Abnormal posture: Secondary | ICD-10-CM

## 2020-08-29 DIAGNOSIS — G8929 Other chronic pain: Secondary | ICD-10-CM

## 2020-08-29 DIAGNOSIS — M545 Low back pain, unspecified: Secondary | ICD-10-CM

## 2020-08-30 ENCOUNTER — Encounter: Payer: Self-pay | Admitting: Physical Therapy

## 2020-08-30 NOTE — Therapy (Signed)
[facility]patient Rehabilitation [facility] [address] [place], [place], [address] Phone: [phone]   Fax:  [phone]  Physical Therapy Treatment  Patient Details  Name: Eric Nunez MRN: [id number] Date of Birth: 06-21-1971 Referring Provider (PT): Dr Florene Route   Encounter Date: 08/29/2020   PT End of Session - 08/30/20 0934    Visit Number 2    Number of Visits 12    Date for PT Re-Evaluation 10/05/20    Authorization Type CAFA    PT Start Time 1545    PT Stop Time 1627    PT Time Calculation (min) 42 min    Activity Tolerance Patient tolerated treatment well    Behavior During Therapy [facility] for tasks assessed/performed           Past Medical History:  Diagnosis Date  . Elevated cholesterol 03/2020  . Elevated TSH 03/2020  . Right shoulder pain 03/2020    Past Surgical History:  Procedure Laterality Date  . APPENDECTOMY    . left knee repair      There were no vitals filed for this visit.   Subjective Assessment - 08/30/20 0929    Subjective Patient reports he feels a little looser in his shoulder but has more numbness. he alos reports increased low back pain but he was moving furniture for the last few days which may have exacebated his low back pain.    Pertinent History left knee debirdement in 1986    Limitations Lifting    How long can you sit comfortably? depends on position but can make his back tight    Currently in Pain? Yes    Pain Score 7     Pain Location Back    Pain Orientation Right;Left;Lower    Pain Descriptors / Indicators Aching    Pain Type Chronic pain    Pain Onset More than a month ago    Pain Frequency Intermittent    Aggravating Factors  lying on his side    Pain Relieving Factors comes and goes    Effect of Pain on Daily Activities numbness into his arm with acitivty                             [facility] Adult PT Treatment/Exercise - 08/30/20 0001      Lumbar Exercises:  Stretches   Active Hamstring Stretch 3 reps;20 seconds    Active Hamstring Stretch Limitations seated with cuing     Other Lumbar Stretch Exercise given new tennis ball for home       Lumbar Exercises: Standing   Other Standing Lumbar Exercises scap retraction with band 2x10 with breathing, shoulder extension withband 2x10 red       Manual Therapy   Manual Therapy Soft tissue mobilization;Joint mobilization;Manual Traction    Manual therapy comments skilled palpation of trigger points     Joint Mobilization grade I and II PA glides from L3-> L5    Soft tissue mobilization to loiwer lumbar spine and upper gluteals; upper trap trigger point release;     Manual Traction LAD to bilateral LE;       Neck Exercises: Stretches   Upper Trapezius Stretch 2 reps;20 seconds;Left    Levator Stretch 2 reps;20 seconds;Left            Trigger Point Dry Needling - 08/30/20 0001    Consent Given? Yes (P)     Education Handout Provided Yes (P)  Muscles Treated Back/Hip Gluteus medius;Lumbar multifidi (P)     Gluteus Medius Response Twitch response elicited (P)     Lumbar multifidi Response Twitch response elicited (P)                 PT Education - 08/30/20 0934    Education Details reviewed HEP and symptom management    Person(s) Educated Patient    Methods Explanation;Demonstration;Tactile cues;Verbal cues    Comprehension Verbalized understanding;Returned demonstration;Verbal cues required;Tactile cues required            PT Short Term Goals - 08/24/20 1138      PT SHORT TERM GOAL #1   Title Patient will demonstrate full lumbar flexion without pain    Time 3    Period Weeks    Status New    Target Date 09/14/20      PT SHORT TERM GOAL #2   Title Patient will reports a 50% reduction in numbness into the hand    Time 3    Period Weeks    Status New    Target Date 09/14/20      PT SHORT TERM GOAL #3   Title Patient will be indepdnent with basic stretching and  strengthening program    Time 3    Period Weeks    Status New    Target Date 09/14/20             PT Long Term Goals - 08/30/20 1209      PT LONG TERM GOAL #1   Title Patient will sleep on his tright side without numbness    Time 6    Period Weeks    Status New      PT LONG TERM GOAL #2   Title Patient will return to the gym without increased back pain or numbness    Time 6    Period Weeks    Status New                 Plan - 08/30/20 0347    Clinical Impression Statement Therapy trialed dry needling on patients back and upper gluteal. he had a great twitch response in both areas. Therapy reviewed exercises for posutre and core stability. He tolerated well. therapy also reviewed the stretches from his HEP. He would benefit from needling to the pper trap but had some anxiety about needling in general. We will work on that over the next few visits. Therapy trailed manual therapy to the upper trap and manual traction. He had no change in his symptoms with manual therapy.    Personal Factors and Comorbidities Profession;Fitness    Examination-Activity Limitations Reach Overhead;Bend;Carry;Lift    Examination-Participation Restrictions Cleaning;Community Activity;Occupation;Laundry    Stability/Clinical Decision Making Evolving/Moderate complexity    Clinical Decision Making Moderate    Rehab Potential Good    PT Frequency 1x / week    PT Duration 6 weeks    PT Treatment/Interventions ADLs/Self Care Home Management;Electrical Stimulation;Cryotherapy;Iontophoresis 4mg /ml Dexamethasone;Moist Heat;DME Instruction;Functional mobility training;Therapeutic activities;Therapeutic exercise;Neuromuscular re-education;Patient/family education;Manual techniques;Passive range of motion;Dry needling;Taping    PT Next Visit Plan add supine, seated, or standing core exercises, consider PPT    PT Home Exercise Plan Access Code: [email or web address] by: .Seated Hamstring  Stretch - 1 x daily - 7 x weekly - 3 sets - 10 reps.Seated Upper Trapezius Stretch - 1 x daily - 7 x weekly - 3 sets - 10 reps.Gentle Levator Scapulae Stretch - 1 x daily - 7 x weekly -  3 sets - 10 reps.Standing Upper Trapezius Mobilization with Small Ball - 1 x daily - 7 x weekly - 2 min hold.Standing Glute Med Mobilization with Small Ball on Wall - 1 x daily - 7 x weekly - 2 min hold    Consulted and Agree with Plan of Care Patient           Patient will benefit from skilled therapeutic intervention in order to improve the following deficits and impairments:  Decreased range of motion, Increased fascial restricitons, Impaired UE functional use, Decreased endurance, Decreased activity tolerance, Decreased mobility, Impaired sensation, Postural dysfunction  Visit Diagnosis: Chronic left-sided low back pain without sciatica  Radiculopathy, cervical region  Abnormal posture     Problem List Patient Active Problem List   Diagnosis Date Noted  . Right shoulder pain 04/16/2020  . Thumb pain, right 04/16/2020  . Follow up 04/16/2020    Dessie Coma PT DPT  08/30/2020, 1:28 PM  [facility] [address] [place], [place], [address] Phone: [phone]   Fax:  [phone]  Name: Sequoyah Counterman MRN: [id number] Date of Birth: 1971-09-06

## 2020-09-04 ENCOUNTER — Other Ambulatory Visit: Payer: Self-pay

## 2020-09-04 ENCOUNTER — Ambulatory Visit: Payer: Self-pay

## 2020-09-04 DIAGNOSIS — G8929 Other chronic pain: Secondary | ICD-10-CM

## 2020-09-04 DIAGNOSIS — M545 Low back pain, unspecified: Secondary | ICD-10-CM

## 2020-09-04 DIAGNOSIS — R293 Abnormal posture: Secondary | ICD-10-CM

## 2020-09-04 DIAGNOSIS — M5412 Radiculopathy, cervical region: Secondary | ICD-10-CM

## 2020-09-04 NOTE — Therapy (Signed)
[facility] Outpatient Rehabilitation [facility] [address] [place], [place], [address] Phone: [phone]   Fax:  [phone]  Physical Therapy Treatment  Patient Details  Name: Eric Nunez MRN: [id number] Date of Birth: Apr 29, 1971 Referring Provider (PT): Dr Florene Route   Encounter Date: 09/04/2020   PT End of Session - 09/04/20 1436    Visit Number 3    Number of Visits 12    Date for PT Re-Evaluation 10/05/20    Authorization Type CAFA    PT Start Time 0236    PT Stop Time 0322    PT Time Calculation (min) 46 min    Activity Tolerance Patient tolerated treatment well    Behavior During Therapy [facility] for tasks assessed/performed           Past Medical History:  Diagnosis Date  . Elevated cholesterol 03/2020  . Elevated TSH 03/2020  . Right shoulder pain 03/2020    Past Surgical History:  Procedure Laterality Date  . APPENDECTOMY    . left knee repair      There were no vitals filed for this visit.   Subjective Assessment - 09/04/20 1506    Subjective Sore LT lower back.  OK post DN but sore from the needle post.    No RT arm pain but still numbness with lying on Rt shoulder    Pain Score 6     Pain Location Back    Pain Orientation Left;Lower    Pain Descriptors / Indicators Aching    Pain Type Chronic pain    Pain Onset More than a month ago    Pain Frequency Intermittent                             [facility] Adult PT Treatment/Exercise - 09/04/20 0001      Neck Exercises: Machines for Strengthening   UBE (Upper Arm Bike) L2 5 min forward      Lumbar Exercises: Stretches   Active Hamstring Stretch Left;2 reps;30 seconds    Lower Trunk Rotation 5 reps    Lower Trunk Rotation Limitations to RT LT knee on RT knee      Lumbar Exercises: Standing   Other Standing Lumbar Exercises scap retraction with blue band  x20 with breathing, shoulder extension withband 2x10 red       Lumbar Exercises: Sidelying    Other Sidelying Lumbar Exercises open book  RT /LT x 15       Manual Therapy   Joint Mobilization grade I and II PA glides from L3-> L5    Soft tissue mobilization to loiwer lumbar spine and upper gluteals; upper trap trigger point release;     Manual Traction LAD to bilateral LE;       Neck Exercises: Stretches   Upper Trapezius Stretch 2 reps;20 seconds;Left    Levator Stretch 2 reps;20 seconds;Left    Other Neck Stretches cross chest stretch RT/LT x 30 sec                    PT Short Term Goals - 08/24/20 1138      PT SHORT TERM GOAL #1   Title Patient will demonstrate full lumbar flexion without pain    Time 3    Period Weeks    Status New    Target Date 09/14/20      PT SHORT TERM GOAL #2   Title Patient will reports a 50% reduction  in numbness into the hand    Time 3    Period Weeks    Status New    Target Date 09/14/20      PT SHORT TERM GOAL #3   Title Patient will be indepdnent with basic stretching and strengthening program    Time 3    Period Weeks    Status New    Target Date 09/14/20             PT Long Term Goals - 08/30/20 1209      PT LONG TERM GOAL #1   Title Patient will sleep on his tright side without numbness    Time 6    Period Weeks    Status New      PT LONG TERM GOAL #2   Title Patient will return to the gym without increased back pain or numbness    Time 6    Period Weeks    Status New                 Plan - 09/04/20 1437    Clinical Impression Statement He Was no worse post session Sore at spot of DN. . Issued blue band for home. Suggested cockup slint for wrist LT.  Suggested  it was ok to have some pain wiht stretching but it can't linger past 5 -10 min.    PT Treatment/Interventions ADLs/Self Care Home Management;Electrical Stimulation;Cryotherapy;Iontophoresis 4mg /ml Dexamethasone;Moist Heat;DME Instruction;Functional mobility training;Therapeutic activities;Therapeutic exercise;Neuromuscular  re-education;Patient/family education;Manual techniques;Passive range of motion;Dry needling;Taping    PT Next Visit Plan add supine, seated, or standing core exercises, consider PPT    PT Home Exercise Plan Access Code: [email or web address] by: .Seated Hamstring Stretch - 1 x daily - 7 x weekly - 3 sets - 10 reps.Seated Upper Trapezius Stretch - 1 x daily - 7 x weekly - 3 sets - 10 reps.Gentle Levator Scapulae Stretch - 1 x daily - 7 x weekly - 3 sets - 10 reps.Standing Upper Trapezius Mobilization with Small Ball - 1 x daily - 7 x weekly - 2 min hold.Standing Glute Med Mobilization with Small Ball on Wall - 1 x daily - 7 x weekly - 2 min hold    Consulted and Agree with Plan of Care Patient           Patient will benefit from skilled therapeutic intervention in order to improve the following deficits and impairments:  Decreased range of motion, Increased fascial restricitons, Impaired UE functional use, Decreased endurance, Decreased activity tolerance, Decreased mobility, Impaired sensation, Postural dysfunction  Visit Diagnosis: Chronic left-sided low back pain without sciatica  Radiculopathy, cervical region  Abnormal posture     Problem List Patient Active Problem List   Diagnosis Date Noted  . Right shoulder pain 04/16/2020  . Thumb pain, right 04/16/2020  . Follow up 04/16/2020    04/18/2020  PT 09/04/2020, 3:27 PM  [facility] Outpatient Rehabilitation [facility] [address] [place], [place], [place] Phone: [phone]   Fax:  [phone]  Name: Eric Nunez MRN: Marily Lente Date of Birth: January 31, 1971

## 2020-09-07 ENCOUNTER — Ambulatory Visit: Payer: No Typology Code available for payment source | Admitting: Physical Medicine and Rehabilitation

## 2020-09-14 ENCOUNTER — Encounter: Payer: Self-pay | Admitting: Physical Therapy

## 2020-09-14 ENCOUNTER — Other Ambulatory Visit: Payer: Self-pay

## 2020-09-14 ENCOUNTER — Ambulatory Visit: Payer: Self-pay | Admitting: Physical Therapy

## 2020-09-14 DIAGNOSIS — M545 Low back pain, unspecified: Secondary | ICD-10-CM

## 2020-09-14 DIAGNOSIS — R293 Abnormal posture: Secondary | ICD-10-CM

## 2020-09-14 DIAGNOSIS — M5412 Radiculopathy, cervical region: Secondary | ICD-10-CM

## 2020-09-14 DIAGNOSIS — G8929 Other chronic pain: Secondary | ICD-10-CM

## 2020-09-14 NOTE — Therapy (Signed)
[facility] Outpatient Rehabilitation [facility] [address] [place], [place], [address] Phone: [phone]   Fax:  [phone]  Physical Therapy Treatment  Patient Details  Name: Eric Nunez MRN: [id number] Date of Birth: June 20, 1971 Referring Provider (PT): Dr Florene Route   Encounter Date: 09/14/2020   PT End of Session - 09/14/20 1052    Visit Number 4    Number of Visits 12    Date for PT Re-Evaluation 10/05/20    Authorization Type CAFA    PT Start Time 0930    PT Stop Time 1012    PT Time Calculation (min) 42 min    Activity Tolerance Patient tolerated treatment well    Behavior During Therapy [facility] for tasks assessed/performed           Past Medical History:  Diagnosis Date  . Elevated cholesterol 03/2020  . Elevated TSH 03/2020  . Right shoulder pain 03/2020    Past Surgical History:  Procedure Laterality Date  . APPENDECTOMY    . left knee repair      There were no vitals filed for this visit.   Subjective Assessment - 09/14/20 0936    Subjective Patient moved some heavey boxes which helped. He feels like the arm bike irritated his arm. He is not sure the needling helped. He has been doing a lot of activity.    Pertinent History left knee debirdement in 1986    Limitations Lifting    How long can you sit comfortably? depends on position but can make his back tight    Diagnostic tests No x-rays in the chart but patient reports he has had x-rays and they are clear    Patient Stated Goals continue to be able to juggle    Currently in Pain? Yes                             [facility] Adult PT Treatment/Exercise - 09/14/20 0001      Neck Exercises: Machines for Strengthening   UBE (Upper Arm Bike) L2 5 min forward      Manual Therapy   Manual therapy comments skilled palpation of trigger points     Soft tissue mobilization upper trp release/ STM to cervical paraspinal     Manual Traction gentle manual cervical  traction; LAD of bilateral LE       Neck Exercises: Stretches   Upper Trapezius Stretch --   reviewed for HEP    Levator Stretch --   reviewed for HEP            Trigger Point Dry Needling - 09/14/20 0001    Consent Given? Yes    Education Handout Provided Yes    Muscles Treated Head and Neck Upper trapezius;Cervical multifidi    Other Dry Needling 2 spots in CM and 2 spots in UT using a 30x50     Upper Trapezius Response Twitch reponse elicited;Palpable increased muscle length    Cervical multifidi Response Twitch reponse elicited                  PT Short Term Goals - 08/24/20 1138      PT SHORT TERM GOAL #1   Title Patient will demonstrate full lumbar flexion without pain    Time 3    Period Weeks    Status New    Target Date 09/14/20      PT SHORT TERM GOAL #2   Title  Patient will reports a 50% reduction in numbness into the hand    Time 3    Period Weeks    Status New    Target Date 09/14/20      PT SHORT TERM GOAL #3   Title Patient will be indepdnent with basic stretching and strengthening program    Time 3    Period Weeks    Status New    Target Date 09/14/20             PT Long Term Goals - 08/30/20 1209      PT LONG TERM GOAL #1   Title Patient will sleep on his tright side without numbness    Time 6    Period Weeks    Status New      PT LONG TERM GOAL #2   Title Patient will return to the gym without increased back pain or numbness    Time 6    Period Weeks    Status New                 Plan - 09/14/20 1301    Clinical Impression Statement Patient had a great twitch respose in his upper trap and cervical paraapinals. He had no change in his radicular symptoms but he was advised to watch intensity and frequency ofver the next few days. Therapy also perfromed manual therapy for the lumbar spine. The focus this visit was on manual therapy. He has been doing his exercises at home. Therapy will assess tolerance to needling next  visit and advance strengthening.    Personal Factors and Comorbidities Profession;Fitness    Examination-Activity Limitations Reach Overhead;Bend;Carry;Lift    Examination-Participation Restrictions Cleaning;Community Activity;Occupation;Laundry    Stability/Clinical Decision Making Evolving/Moderate complexity    Clinical Decision Making Moderate    Rehab Potential Good    PT Frequency 1x / week    PT Duration 6 weeks    PT Treatment/Interventions ADLs/Self Care Home Management;Electrical Stimulation;Cryotherapy;Iontophoresis 4mg /ml Dexamethasone;Moist Heat;DME Instruction;Functional mobility training;Therapeutic activities;Therapeutic exercise;Neuromuscular re-education;Patient/family education;Manual techniques;Passive range of motion;Dry needling;Taping    PT Next Visit Plan add supine, seated, or standing core exercises, consider PPT    PT Home Exercise Plan Access Code: [email or web address] by: .Seated Hamstring Stretch - 1 x daily - 7 x weekly - 3 sets - 10 reps.Seated Upper Trapezius Stretch - 1 x daily - 7 x weekly - 3 sets - 10 reps.Gentle Levator Scapulae Stretch - 1 x daily - 7 x weekly - 3 sets - 10 reps.Standing Upper Trapezius Mobilization with Small Ball - 1 x daily - 7 x weekly - 2 min hold.Standing Glute Med Mobilization with Small Ball on Wall - 1 x daily - 7 x weekly - 2 min hold    Consulted and Agree with Plan of Care Patient           Patient will benefit from skilled therapeutic intervention in order to improve the following deficits and impairments:  Decreased range of motion, Increased fascial restricitons, Impaired UE functional use, Decreased endurance, Decreased activity tolerance, Decreased mobility, Impaired sensation, Postural dysfunction  Visit Diagnosis: Chronic left-sided low back pain without sciatica  Radiculopathy, cervical region  Abnormal posture     Problem List Patient Active Problem List   Diagnosis Date Noted  . Right  shoulder pain 04/16/2020  . Thumb pain, right 04/16/2020  . Follow up 04/16/2020    04/18/2020 PT DPT  09/14/2020, 1:10 PM  [facility] Out[facility] Rehabilitation [facility] [id number]  [address] [place], [place], [address] Phone: [phone]   Fax:  [phone]  Name: Eric Nunez MRN: [id number] Date of Birth: 02-16-1971

## 2020-09-20 NOTE — Progress Notes (Signed)
Subjective:    Patient ID: Eric Nunez, male    DOB: Oct 16, 1971, 49 y.o.   MRN: [id number]  HPI  Eric Nunez is a 49 year old man who presents to establish care for pain. He pulled a muscle in January moving a box. He had difficulty moving his arm. He got XR in April. Right shoulder XR shows mild AC and glenohumeral joint arthritis.  Since last visit he has been doing PT and this has helped. He has been doing a lot of needling and it is not quite the at the point that is helping. His finger went numb during the therapy session.   His boss has still been given him pretty hard work- he has had to do a lot of lifting.  If he puts pressure or leans on his arm it goes numb.   He shows a video to me.   In PT he was given several exercises to do and he has been doing these almost every day.   Prior history: He had numbness in his hand and his arm felt it was on fire. He had pain there after his vaccination, but the pain started before his vaccination. His current pain is gone. When he sleeps on that side it goes numb. No issues on the other side.   He tries to follow an anti-inflammatory diet. Currently has no pain. Only has numbness with those positions, not at rest.   Also complaints of lower back support in left lower back.   He also complains of some 5th metatarsal irritation.   Pain Inventory Average Pain 0 Pain Right Now 4 My pain is sharp, burning, dull, stabbing, tingling and aching  In the last 24 hours, has pain interfered with the following? General activity 7 Relation with others 8 Enjoyment of life 9 What TIME of day is your pain at its worst? morning , evening and night Sleep (in general) Poor  Pain is worse with: pressure Pain improves with: rest and drugs Relief from Meds: 7     Family History  Problem Relation Age of Onset  . Diabetes Mother   . Diabetes Father    Social History   Socioeconomic History  . Marital status: Single     Spouse name: Not on file  . Number of children: Not on file  . Years of education: Not on file  . Highest education level: Not on file  Occupational History  . Not on file  Tobacco Use  . Smoking status: Never Smoker  . Smokeless tobacco: Never Used  Vaping Use  . Vaping Use: Never used  Substance and Sexual Activity  . Alcohol use: Yes    Comment: rare  . Drug use: Not Currently  . Sexual activity: Not Currently  Other Topics Concern  . Not on file  Social History Narrative  . Not on file   Social Determinants of Health   Financial Resource Strain:   . Difficulty of Paying Living Expenses: Not on file  Food Insecurity:   . Worried About Programme researcher, broadcasting/film/video in the Last Year: Not on file  . Ran Out of Food in the Last Year: Not on file  Transportation Needs:   . Lack of Transportation (Medical): Not on file  . Lack of Transportation (Non-Medical): Not on file  Physical Activity:   . Days of Exercise per Week: Not on file  . Minutes of Exercise per Session: Not on file  Stress:   . Feeling of  Stress : Not on file  Social Connections:   . Frequency of Communication with Friends and Family: Not on file  . Frequency of Social Gatherings with Friends and Family: Not on file  . Attends Religious Services: Not on file  . Active Member of Clubs or Organizations: Not on file  . Attends Banker Meetings: Not on file  . Marital Status: Not on file   Past Surgical History:  Procedure Laterality Date  . APPENDECTOMY    . left knee repair     Past Medical History:  Diagnosis Date  . Elevated cholesterol 03/2020  . Elevated TSH 03/2020  . Right shoulder pain 03/2020   BP 114/78   Pulse 65   Temp 98.6 F (37 C)   Ht 5\' 9"  (1.753 m)   Wt 230 lb 9.6 oz (104.6 kg)   SpO2 95%   BMI 34.05 kg/m   Opioid Risk Score:   Fall Risk Score:  `1  Depression screen PHQ 2/9  Depression screen [facility] 2/9 08/10/2020 03/26/2020  Decreased Interest 3 0  Down, Depressed,  Hopeless 3 0  PHQ - 2 Score 6 0  Altered sleeping 3 -  Tired, decreased energy 2 -  Change in appetite 2 -  Feeling bad or failure about yourself  3 -  Trouble concentrating 1 -  Moving slowly or fidgety/restless 0 -  Suicidal thoughts 3 -  PHQ-9 Score 20 -  Difficult doing work/chores Extremely dIfficult -    Review of Systems  Constitutional: Positive for unexpected weight change.  HENT: Negative.   Eyes: Negative.   Respiratory: Negative.   Cardiovascular: Negative.   Gastrointestinal: Negative.   Endocrine: Negative.   Genitourinary: Negative.   Musculoskeletal: Positive for back pain.  Skin: Negative.   Allergic/Immunologic: Negative.   Neurological: Positive for numbness.       Tingling  Hematological: Negative.   Psychiatric/Behavioral: Positive for dysphoric mood and suicidal ideas. The patient is nervous/anxious.   All other systems reviewed and are negative.      Objective:   Physical Exam Gen: no distress, normal appearing HEENT: oral mucosa pink and moist, NCAT Cardio: Reg rate Chest: normal effort, normal rate of breathing Abd: soft, non-distended Ext: no edema Skin: intact Neuro: Alert and oriented x3.  Musculoskeletal: 5/5 strength throughout upper extremities. Negative Neer's test. Sensation intact. Left L5 paraspinal muscle tightness Psych: pleasant, normal affect    Assessment & Plan:  Mr. 05/26/2020 is a 49 year old man who presents to establish care for 1) right sided radial neuropathy and 2) left L5 paraspinal muscle spasm.  -Provided note with work accommodations: Given Mr. 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 He may not do these activities on his own at risk of further injuring his back and right shoulder and aggravating radial nerve compression. He also requires a higher chair.  -Continue PT which is helping. He is very diligent with his HEP.   -Pain is only  present with positional changes or compression on that shoulder.  -Continue anti-inflammatory diet. He is doing the best he can with this. He is a vegan and so is trying to focus on as much as vegetables as possible. Made goal to avoid more processed foods.   -Advised that he can continue his exercise program.   -Continue drinking water. Drinks 8 oz of water per day.   -Continue walking 5 miles per day.  -Weight is  230 lbs, BMI is 34.05   -He does not smoke  -He has a beer every now and then.   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  -CT abdomen/pelvis relatively normal -Discussed above results with patient.   -Provided refill of ibuprofen.   All questions answered. RTC in 4 weeks.  40 minutes spent in discussion of patient's right radial nerve pain, discussion of weight and goal to avoid more processed foods, review of his CTA head/neck, pain in right knee, recent fall, discussion regarding foods he eats, no diet or smoking., discussion of the amount of water he is drinking, providing him note with work accommodations.

## 2020-09-21 ENCOUNTER — Other Ambulatory Visit: Payer: Self-pay

## 2020-09-21 ENCOUNTER — Encounter
Payer: No Typology Code available for payment source | Attending: Physical Medicine and Rehabilitation | Admitting: Physical Medicine and Rehabilitation

## 2020-09-21 ENCOUNTER — Ambulatory Visit: Payer: Self-pay | Attending: Physical Medicine and Rehabilitation | Admitting: Physical Therapy

## 2020-09-21 ENCOUNTER — Encounter: Payer: Self-pay | Admitting: Physical Therapy

## 2020-09-21 ENCOUNTER — Encounter: Payer: Self-pay | Admitting: Physical Medicine and Rehabilitation

## 2020-09-21 VITALS — BP 114/78 | HR 65 | Temp 98.6°F | Ht 69.0 in | Wt 230.6 lb

## 2020-09-21 DIAGNOSIS — M62830 Muscle spasm of back: Secondary | ICD-10-CM | POA: Insufficient documentation

## 2020-09-21 DIAGNOSIS — M545 Low back pain, unspecified: Secondary | ICD-10-CM | POA: Insufficient documentation

## 2020-09-21 DIAGNOSIS — M25511 Pain in right shoulder: Secondary | ICD-10-CM

## 2020-09-21 DIAGNOSIS — G5631 Lesion of radial nerve, right upper limb: Secondary | ICD-10-CM | POA: Insufficient documentation

## 2020-09-21 DIAGNOSIS — G8929 Other chronic pain: Secondary | ICD-10-CM | POA: Insufficient documentation

## 2020-09-21 DIAGNOSIS — M79644 Pain in right finger(s): Secondary | ICD-10-CM | POA: Insufficient documentation

## 2020-09-21 DIAGNOSIS — R293 Abnormal posture: Secondary | ICD-10-CM | POA: Insufficient documentation

## 2020-09-21 DIAGNOSIS — M5412 Radiculopathy, cervical region: Secondary | ICD-10-CM | POA: Insufficient documentation

## 2020-09-21 MED ORDER — IBUPROFEN 600 MG PO TABS: 600.0000 mg | ORAL_TABLET | Freq: Four times a day (QID) | ORAL | 3 refills | Status: DC | PRN

## 2020-09-21 MED FILL — IBUPROFEN 600 MG TABLET: 600 | 8 days supply | Qty: 30 | Fill #0

## 2020-09-21 NOTE — Therapy (Signed)
[facility] Outpatient Rehabilitation [facility] [address] [place], [place], [address] Phone: [phone]   Fax:  [phone]  Physical Therapy Treatment  Patient Details  Name: Eric Nunez MRN: [id number] Date of Birth: [birth year] Referring Provider (PT): Dr Florene Route   Encounter Date: 09/21/2020   PT End of Session - 09/21/20 1023    Visit Number 5    Number of Visits 12    Date for PT Re-Evaluation 10/05/20    Authorization Type CAFA    PT Start Time 0930    PT Stop Time 1020    PT Time Calculation (min) 50 min    Activity Tolerance Patient tolerated treatment well    Behavior During Therapy [facility] for tasks assessed/performed           Past Medical History:  Diagnosis Date   Elevated cholesterol 03/2020   Elevated TSH 03/2020   Right shoulder pain 03/2020    Past Surgical History:  Procedure Laterality Date   APPENDECTOMY     left knee repair      There were no vitals filed for this visit.   Subjective Assessment - 09/21/20 0932    Subjective Patient had a rough week. He had to do a lot of ligfting. he is still having pain in his upper trap. He wasn't able to do his stretches and things because he was busy and exahiusted. He reports maybe a little improvement with the numbness and tingling when he is sleeping but it may be because he is tired.    Pertinent History left knee debirdement in 1986    Limitations Lifting    How long can you sit comfortably? depends on position but can make his back tight    Diagnostic tests No x-rays in the chart but patient reports he has had x-rays and they are clear    Patient Stated Goals continue to be able to juggle    Currently in Pain? No/denies    Pain Score 8     Pain Location Back    Pain Orientation Left;Lower    Pain Descriptors / Indicators Aching    Pain Type Chronic pain    Pain Radiating Towards follows a radial nerve distrabution    Pain Onset More than a month ago     Pain Frequency Intermittent    Aggravating Factors  lying on his side    Pain Relieving Factors come and goes    Effect of Pain on Daily Activities numbness into the arm                             [facility] Adult PT Treatment/Exercise - 09/21/20 0001      Self-Care   Self-Care Other Self-Care Comments    Other Self-Care Comments  work place set up and proper support of the right hand. Improtance of a good chair preferably one with arms.  Use of the thera-vcane for neck and back.       Manual Therapy   Manual therapy comments skilled palpation of trigger points     Joint Mobilization grade I and II PA glides from L3-> L5 and aadded mid throacic area today .     Soft tissue mobilization upper trp release/ STM to cervical paraspinal     Manual Traction gentle manual cervical traction; LAD of bilateral LE ; sub-occipital trrelease             Trigger Point Dry Needling -  09/21/20 0001    Consent Given? Yes    Education Handout Provided Yes    Other Dry Needling 3 spots in CM and 2 spots in UT using a 30x50     Upper Trapezius Response Twitch reponse elicited;Palpable increased muscle length                PT Education - 09/21/20 1022    Education Details reviewed use of thera-cane and proper desk ser up    [employer]) Educated Patient    Methods Explanation;Demonstration;Tactile cues;Verbal cues    Comprehension Verbalized understanding;Returned demonstration;Verbal cues required;Tactile cues required            PT Short Term Goals - 08/24/20 1138      PT SHORT TERM GOAL #1   Title Patient will demonstrate full lumbar flexion without pain    Time 3    Period Weeks    Status New    Target Date 09/14/20      PT SHORT TERM GOAL #2   Title Patient will reports a 50% reduction in numbness into the hand    Time 3    Period Weeks    Status New    Target Date 09/14/20      PT SHORT TERM GOAL #3   Title Patient will be indepdnent with basic stretching  and strengthening program    Time 3    Period Weeks    Status New    Target Date 09/14/20             PT Long Term Goals - 08/30/20 1209      PT LONG TERM GOAL #1   Title Patient will sleep on his tright side without numbness    Time 6    Period Weeks    Status New      PT LONG TERM GOAL #2   Title Patient will return to the gym without increased back pain or numbness    Time 6    Period Weeks    Status New                 Plan - 09/21/20 1023    Clinical Impression Statement Patient took a video of his work spce. 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 This could be a contributing factor to his numbness and tingling. He continues to have numbness and tigling with compression of his shoulder as well. Therapyfocused on decompression of the cervical spine and manual therapy to the back and neck. He had more tingling from lying flat. He continues to have spasming in his low back but he has been lifting heavey boxes all week. That is going to slow his progress because he is unable to use his exercises to strengthen his core because he is fatigued and iun pain by the end of the day. He is also traveling for his other job. He does feel like his function of his right arm is improving and overall his back is slowly trneding in the right direction. The continued parathesias with normally tolerable positioning is concerning. Patient will see D today. We will continue to follow per MD reccomendations. Therapy reviewed use of the thera-cane. he has one and woill get it out.    Personal Factors and Comorbidities Profession;Fitness    Examination-Activity Limitations Reach Overhead;Bend;Carry;Lift    Examination-Participation Restrictions Cleaning;Community Activity;Occupation;Laundry    Clinical Decision Making Moderate    Rehab  Potential Good    PT Frequency 1x / week    PT Duration 6 weeks    PT Treatment/Interventions ADLs/Self Care  Home Management;Electrical Stimulation;Cryotherapy;Iontophoresis 4mg /ml Dexamethasone;Moist Heat;DME Instruction;Functional mobility training;Therapeutic activities;Therapeutic exercise;Neuromuscular re-education;Patient/family education;Manual techniques;Passive range of motion;Dry needling;Taping    PT Next Visit Plan add supine, seated, or standing core exercises, consider PPT    PT Home Exercise Plan Access Code: [email or web address] by: CarrollExercisesSeated Hamstring Stretch - 1 x daily - 7 x weekly - 3 sets - 10 repsSeated Upper Trapezius Stretch - 1 x daily - 7 x weekly - 3 sets - 10 repsGentle Levator Scapulae Stretch - 1 x daily - 7 x weekly - 3 sets - 10 repsStanding Upper Trapezius Mobilization with Small Ball - 1 x daily - 7 x weekly - 2 min holdStanding Glute Med Mobilization with Small Ball on Wall - 1 x daily - 7 x weekly - 2 min hold    Consulted and Agree with Plan of Care Patient           Patient will benefit from skilled therapeutic intervention in order to improve the following deficits and impairments:  Decreased range of motion, Increased fascial restricitons, Impaired UE functional use, Decreased endurance, Decreased activity tolerance, Decreased mobility, Impaired sensation, Postural dysfunction  Visit Diagnosis: Chronic left-sided low back pain without sciatica  Radiculopathy, cervical region  Abnormal posture     Problem List Patient Active Problem List   Diagnosis Date Noted   Right shoulder pain 04/16/2020   Thumb pain, right 04/16/2020   Follow up 04/16/2020    04/18/2020  PT DPT  09/21/2020, 10:33 AM  [facility] [address] [place], [place], [place] Phone: [phone]   Fax:  [phone]  Name: Eric Nunez MRN: [id number] Date of Birth: 07-24-1971

## 2020-10-05 ENCOUNTER — Encounter: Payer: Self-pay | Admitting: Physical Therapy

## 2020-10-05 ENCOUNTER — Other Ambulatory Visit: Payer: Self-pay

## 2020-10-05 ENCOUNTER — Ambulatory Visit: Payer: Self-pay | Admitting: Physical Therapy

## 2020-10-05 DIAGNOSIS — R293 Abnormal posture: Secondary | ICD-10-CM

## 2020-10-05 DIAGNOSIS — M5412 Radiculopathy, cervical region: Secondary | ICD-10-CM

## 2020-10-05 DIAGNOSIS — G8929 Other chronic pain: Secondary | ICD-10-CM

## 2020-10-05 MED FILL — ?IBUPROFEN 600 MG TABLETS: 600 | 8 days supply | Qty: 30 | Fill #0

## 2020-10-05 NOTE — Therapy (Signed)
[facility] Outpatient Rehabilitation [facility] [address] [place], [place], [address] Phone: [phone]   Fax:  [phone]  Physical Therapy Treatment  Patient Details  Name: Eric Nunez MRN: [id number] Date of Birth: 06/05/1971 Referring Provider (PT): Dr Florene Route   Encounter Date: 10/05/2020   PT End of Session - 10/05/20 1054    Visit Number 6    Number of Visits 12    Date for PT Re-Evaluation 10/05/20    Authorization Type CAFA    PT Start Time 1017    PT Stop Time 1104    PT Time Calculation (min) 47 min    Activity Tolerance Patient tolerated treatment well    Behavior During Therapy [facility] for tasks assessed/performed           Past Medical History:  Diagnosis Date  . Elevated cholesterol 03/2020  . Elevated TSH 03/2020  . Right shoulder pain 03/2020    Past Surgical History:  Procedure Laterality Date  . APPENDECTOMY    . left knee repair      There were no vitals filed for this visit.   Subjective Assessment - 10/05/20 1024    Subjective Patient reports a little less numbness. His neck is still a little sore. He feels like he may be over stretching. H ehas been sleeping in a hotel bed which didn't help.    Pertinent History left knee debirdement in 1986    Limitations Lifting    How long can you sit comfortably? depends on position but can make his back tight    Diagnostic tests No x-rays in the chart but patient reports he has had x-rays and they are clear    Patient Stated Goals continue to be able to juggle    Currently in Pain? No/denies    Pain Score 5     Pain Location Neck    Pain Orientation Right;Left    Pain Descriptors / Indicators Aching    Pain Type Chronic pain    Pain Onset More than a month ago    Pain Frequency Intermittent    Aggravating Factors  lying on his side    Pain Relieving Factors comes and goes    Effect of Pain on Daily Activities numbness into the arm                              [facility] Adult PT Treatment/Exercise - 10/05/20 0001      Neck Exercises: Standing   Other Standing Exercises chop green 2x10; pallof press 2x10 each side green       Lumbar Exercises: Stretches   Piriformis Stretch Limitations 2x20 sec hold       Lumbar Exercises: Supine   Other Supine Lumbar Exercises double knee to chest ball 2x10; bridge on ball 2x10      Manual Therapy   Manual therapy comments skilled palpation of trigger points     Joint Mobilization grade I and II PA glides from L3-> L5 and aadded mid throacic area today .     Soft tissue mobilization upper trp release/ STM to cervical paraspinal     Manual Traction gentle manual cervical traction; LAD of bilateral LE ; sub-occipital trrelease  Grade 5 minipulation of hip 2x each side                   PT Education - 10/05/20 1053    Education Details reviewed tehcnique with  ball exercises    Person(s) Educated Patient    Methods Explanation;Demonstration;Tactile cues;Verbal cues    Comprehension Verbalized understanding;Returned demonstration;Verbal cues required;Tactile cues required            PT Short Term Goals - 08/24/20 1138      PT SHORT TERM GOAL #1   Title Patient will demonstrate full lumbar flexion without pain    Time 3    Period Weeks    Status New    Target Date 09/14/20      PT SHORT TERM GOAL #2   Title Patient will reports a 50% reduction in numbness into the hand    Time 3    Period Weeks    Status New    Target Date 09/14/20      PT SHORT TERM GOAL #3   Title Patient will be indepdnent with basic stretching and strengthening program    Time 3    Period Weeks    Status New    Target Date 09/14/20             PT Long Term Goals - 08/30/20 1209      PT LONG TERM GOAL #1   Title Patient will sleep on his tright side without numbness    Time 6    Period Weeks    Status New      PT LONG TERM GOAL #2   Title Patient will return to the  gym without increased back pain or numbness    Time 6    Period Weeks    Status New                 Plan - 10/05/20 1056    Clinical Impression Statement Therapy updated HEP for higher level core strengthening. He was given a pallof press and a chop. Therapy also reviewd ball exercsaires for home. He is not sure if he has a ball or not. Therapy continues to work on manual therapy with the patient. Therapy perfromed grade 5 mobilization on the hips. He reported it felt verygood. He declined needling today. Therapy will continue to progress ther-ex as tolerated. We will re-assess next visit.    Personal Factors and Comorbidities Profession;Fitness    Examination-Activity Limitations Reach Overhead;Bend;Carry;Lift    Examination-Participation Restrictions Cleaning;Community Activity;Occupation;Laundry    Stability/Clinical Decision Making Evolving/Moderate complexity    Clinical Decision Making Moderate    Rehab Potential Good    PT Frequency 1x / week    PT Duration 6 weeks    PT Treatment/Interventions ADLs/Self Care Home Management;Electrical Stimulation;Cryotherapy;Iontophoresis 4mg /ml Dexamethasone;Moist Heat;DME Instruction;Functional mobility training;Therapeutic activities;Therapeutic exercise;Neuromuscular re-education;Patient/family education;Manual techniques;Passive range of motion;Dry needling;Taping    PT Next Visit Plan add supine, seated, or standing core exercises, consider PPT    PT Home Exercise Plan Access Code: [email or web address] by: .Seated Hamstring Stretch - 1 x daily - 7 x weekly - 3 sets - 10 reps.Seated Upper Trapezius Stretch - 1 x daily - 7 x weekly - 3 sets - 10 reps.Gentle Levator Scapulae Stretch - 1 x daily - 7 x weekly - 3 sets - 10 reps.Standing Upper Trapezius Mobilization with Small Ball - 1 x daily - 7 x weekly - 2 min hold.Standing Glute Med Mobilization with Small Ball on Wall - 1 x daily - 7 x weekly - 2 min hold    Consulted and  Agree with Plan of Care Patient           Patient will benefit from skilled  therapeutic intervention in order to improve the following deficits and impairments:  Decreased range of motion, Increased fascial restricitons, Impaired UE functional use, Decreased endurance, Decreased activity tolerance, Decreased mobility, Impaired sensation, Postural dysfunction  Visit Diagnosis: Chronic left-sided low back pain without sciatica  Radiculopathy, cervical region  Abnormal posture     Problem List Patient Active Problem List   Diagnosis Date Noted  . Right shoulder pain 04/16/2020  . Thumb pain, right 04/16/2020  . Follow up 04/16/2020    Dessie Coma PT DPT  10/05/2020, 11:42 AM  [facility] [address] [place], [place], [address] Phone: [phone]   Fax:  [phone]  Name: Eric Nunez MRN: [id number] Date of Birth: June 04, 1971

## 2020-10-12 ENCOUNTER — Ambulatory Visit: Payer: Self-pay | Admitting: Physical Therapy

## 2020-10-12 ENCOUNTER — Encounter: Payer: Self-pay | Admitting: Physical Therapy

## 2020-10-12 ENCOUNTER — Other Ambulatory Visit: Payer: Self-pay

## 2020-10-12 DIAGNOSIS — M5412 Radiculopathy, cervical region: Secondary | ICD-10-CM

## 2020-10-12 DIAGNOSIS — G8929 Other chronic pain: Secondary | ICD-10-CM

## 2020-10-12 DIAGNOSIS — R293 Abnormal posture: Secondary | ICD-10-CM

## 2020-10-12 NOTE — Therapy (Signed)
[facility] Outpatient Rehabilitation [facility] [address] [place], [place], [address] Phone: [phone]   Fax:  [phone]  Physical Therapy Treatment  Patient Details  Name: Eric Nunez MRN: [id number] Date of Birth: 04/05/1971 Referring Provider (PT): Dr Florene Route   Encounter Date: 10/12/2020   PT End of Session - 10/12/20 1051    Visit Number 7    Number of Visits 12    Date for PT Re-Evaluation 10/05/20    Authorization Type CAFA    PT Start Time 1016    PT Stop Time 1100    PT Time Calculation (min) 44 min    Activity Tolerance Patient tolerated treatment well    Behavior During Therapy [facility] for tasks assessed/performed           Past Medical History:  Diagnosis Date   Elevated cholesterol 03/2020   Elevated TSH 03/2020   Right shoulder pain 03/2020    Past Surgical History:  Procedure Laterality Date   APPENDECTOMY     left knee repair      There were no vitals filed for this visit.   Subjective Assessment - 10/12/20 1047    Subjective Patient reports his neck has been the worst problem for him. he reports he feels like it has been overstretchesd and overworked. He had more help this week at work whcih helped    Pertinent History left knee debirdement in 1986    Limitations Lifting    How long can you sit comfortably? depends on position but can make his back tight    Diagnostic tests No x-rays in the chart but patient reports he has had x-rays and they are clear    Patient Stated Goals continue to be able to juggle    Currently in Pain? Yes    Pain Score 4     Pain Location Neck    Pain Orientation Right    Pain Descriptors / Indicators Aching    Pain Type Chronic pain    Pain Radiating Towards follows radial nerve distracution    Pain Onset More than a month ago    Pain Frequency Intermittent    Aggravating Factors  lying    Pain Relieving Factors comes and goes    Effect of Pain on Daily Activities  numbness into the arms                             [facility] Adult PT Treatment/Exercise - 10/12/20 0001      Neck Exercises: Standing   Other Standing Exercises chop blue 2x10; pallof press 2x10 each side blue    Other Standing Exercises goble squat 2x10; significant time spent reviewed proper lifting techniuque       Manual Therapy   Manual therapy comments skilled palpation of trigger points     Joint Mobilization grade I and II PA glides from L3-> L5 and aadded mid throacic area today .     Soft tissue mobilization upper trp release/ STM to cervical paraspinal     Manual Traction gentle manual cervical traction; LAD of bilateral LE ; sub-occipital trrelease  Grade 5 minipulation of hip 2x each side                   PT Education - 10/12/20 1050    Education Details reviewed tehcnique with ther-ex    Person(s) Educated Patient    Methods Demonstration;Explanation;Tactile cues;Verbal cues    Comprehension  Verbalized understanding;Returned demonstration;Verbal cues required;Tactile cues required            PT Short Term Goals - 08/24/20 1138      PT SHORT TERM GOAL #1   Title Patient will demonstrate full lumbar flexion without pain    Time 3    Period Weeks    Status New    Target Date 09/14/20      PT SHORT TERM GOAL #2   Title Patient will reports a 50% reduction in numbness into the hand    Time 3    Period Weeks    Status New    Target Date 09/14/20      PT SHORT TERM GOAL #3   Title Patient will be indepdnent with basic stretching and strengthening program    Time 3    Period Weeks    Status New    Target Date 09/14/20             PT Long Term Goals - 08/30/20 1209      PT LONG TERM GOAL #1   Title Patient will sleep on his tright side without numbness    Time 6    Period Weeks    Status New      PT LONG TERM GOAL #2   Title Patient will return to the gym without increased back pain or numbness    Time 6    Period Weeks     Status New                 Plan - 10/12/20 1051    Clinical Impression Statement Therapy focused on his neck. Therapy perfromed manual therapy to loosen his neck. He had increased pain in his neck with pallof press. He was advised not to do exercises that hurt the neck. He was shown proper lifting technique and how to do goblet squats at home.    Personal Factors and Comorbidities Profession;Fitness    Examination-Activity Limitations Reach Overhead;Bend;Carry;Lift    Examination-Participation Restrictions Cleaning;Community Activity;Occupation;Laundry    Stability/Clinical Decision Making Evolving/Moderate complexity    Clinical Decision Making Moderate    Rehab Potential Good    PT Frequency 1x / week    PT Duration 6 weeks    PT Treatment/Interventions ADLs/Self Care Home Management;Electrical Stimulation;Cryotherapy;Iontophoresis 4mg /ml Dexamethasone;Moist Heat;DME Instruction;Functional mobility training;Therapeutic activities;Therapeutic exercise;Neuromuscular re-education;Patient/family education;Manual techniques;Passive range of motion;Dry needling;Taping    PT Next Visit Plan add supine, seated, or standing core exercises, consider PPT    PT Home Exercise Plan Access Code: [email or web address] by: CarrollExercisesSeated Hamstring Stretch - 1 x daily - 7 x weekly - 3 sets - 10 repsSeated Upper Trapezius Stretch - 1 x daily - 7 x weekly - 3 sets - 10 repsGentle Levator Scapulae Stretch - 1 x daily - 7 x weekly - 3 sets - 10 repsStanding Upper Trapezius Mobilization with Small Ball - 1 x daily - 7 x weekly - 2 min holdStanding Glute Med Mobilization with Small Ball on Wall - 1 x daily - 7 x weekly - 2 min hold    Consulted and Agree with Plan of Care Patient           Patient will benefit from skilled therapeutic intervention in order to improve the following deficits and impairments:  Decreased range of motion, Increased fascial restricitons, Impaired UE functional  use, Decreased endurance, Decreased activity tolerance, Decreased mobility, Impaired sensation, Postural dysfunction  Visit Diagnosis: Radiculopathy, cervical region  Chronic left-sided low back pain  without sciatica  Abnormal posture     Problem List Patient Active Problem List   Diagnosis Date Noted   Right shoulder pain 04/16/2020   Thumb pain, right 04/16/2020   Follow up 04/16/2020    Dessie Coma PT DPT  10/12/2020, 12:39 PM  [facility] Outpatient Rehabilitation [facility] [address] [place], [place], [address] Phone: [phone]   Fax:  [phone]  Name: Heston Widener MRN: [id number] Date of Birth: Aug 20, 1971

## 2020-10-19 ENCOUNTER — Ambulatory Visit: Payer: Self-pay | Admitting: Physical Therapy

## 2020-10-19 ENCOUNTER — Other Ambulatory Visit: Payer: Self-pay

## 2020-10-19 DIAGNOSIS — M5412 Radiculopathy, cervical region: Secondary | ICD-10-CM

## 2020-10-19 DIAGNOSIS — G8929 Other chronic pain: Secondary | ICD-10-CM

## 2020-10-19 DIAGNOSIS — R293 Abnormal posture: Secondary | ICD-10-CM

## 2020-10-19 NOTE — Therapy (Signed)
[facility] [address] [place], [place], [address] Phone: [phone]   Fax:  [phone]  Physical Therapy Treatment?Progress note   Patient Details  Name: Eric Nunez MRN: [id number] Date of Birth: 1971-01-28 Referring Provider (PT): Dr Florene Route   Encounter Date: 10/19/2020   PT End of Session - 10/19/20 1034    Visit Number 8    Number of Visits 14    Date for PT Re-Evaluation 11/30/20    Authorization Type CAFA    PT Start Time 1015    PT Stop Time 1058    PT Time Calculation (min) 43 min    Activity Tolerance Patient tolerated treatment well    Behavior During Therapy [facility] for tasks assessed/performed           Past Medical History:  Diagnosis Date   Elevated cholesterol 03/2020   Elevated TSH 03/2020   Right shoulder pain 03/2020    Past Surgical History:  Procedure Laterality Date   APPENDECTOMY     left knee repair      There were no vitals filed for this visit.       [facility] PT Assessment - 10/19/20 0001      AROM   Cervical - Left Side Bend 76    Cervical - Right Rotation 65                         [facility] Adult PT Treatment/Exercise - 10/19/20 0001      Neck Exercises: Machines for Strengthening   Cybex Row 3x10 25lb     Lat Pull 2x10 15 lb       Manual Therapy   Manual therapy comments skilled palpation of trigger points     Joint Mobilization grade I and II PA glides from L3-> L5 and aadded mid throacic area today .     Soft tissue mobilization upper trp release/ STM to cervical paraspinal     Manual Traction gentle manual cervical traction; LAD of bilateral LE ; sub-occipital trrelease  Grade 5 minipulation of hip 2x each side                     PT Short Term Goals - 08/24/20 1138      PT SHORT TERM GOAL #1   Title Patient will demonstrate full lumbar flexion without pain    Time 3    Period Weeks    Status New    Target Date  09/14/20      PT SHORT TERM GOAL #2   Title Patient will reports a 50% reduction in numbness into the hand    Time 3    Period Weeks    Status New    Target Date 09/14/20      PT SHORT TERM GOAL #3   Title Patient will be indepdnent with basic stretching and strengthening program    Time 3    Period Weeks    Status New    Target Date 09/14/20             PT Long Term Goals - 08/30/20 1209      PT LONG TERM GOAL #1   Title Patient will sleep on his tright side without numbness    Time 6    Period Weeks    Status New      PT LONG TERM GOAL #2   Title Patient will return to the gym without increased  back pain or numbness    Time 6    Period Weeks    Status New                 Plan - 10/19/20 1034    Clinical Impression Statement Therapy continues to progress patients exercises. He tolerated well. He was fatigued but no significant increase in pain.Therapy added machine strengthening. He was challeged with the weight. Overall the patient is making progress. The intensity and frequency of hiis numbness is less. He still can have it when lying on his shoulder. He eels like his pain on a regular basis is improved but his job still makes it difficult to strengthen since he lifts    Examination-Activity Limitations Reach Overhead;Bend;Carry;Lift    Examination-Participation Restrictions Cleaning;Community Activity;Occupation;Laundry    Stability/Clinical Decision Making Evolving/Moderate complexity    Rehab Potential Good    PT Frequency 1x / week    PT Duration 6 weeks    PT Treatment/Interventions ADLs/Self Care Home Management;Electrical Stimulation;Cryotherapy;Iontophoresis 4mg /ml Dexamethasone;Moist Heat;DME Instruction;Functional mobility training;Therapeutic activities;Therapeutic exercise;Neuromuscular re-education;Patient/family education;Manual techniques;Passive range of motion;Dry needling;Taping    PT Next Visit Plan add supine, seated, or standing core  exercises, consider PPT    PT Home Exercise Plan Access Code: [email or web address] by: CarrollExercisesSeated Hamstring Stretch - 1 x daily - 7 x weekly - 3 sets - 10 repsSeated Upper Trapezius Stretch - 1 x daily - 7 x weekly - 3 sets - 10 repsGentle Levator Scapulae Stretch - 1 x daily - 7 x weekly - 3 sets - 10 repsStanding Upper Trapezius Mobilization with Small Ball - 1 x daily - 7 x weekly - 2 min holdStanding Glute Med Mobilization with Small Ball on Wall - 1 x daily - 7 x weekly - 2 min hold    Consulted and Agree with Plan of Care Patient           Patient will benefit from skilled therapeutic intervention in order to improve the following deficits and impairments:     Visit Diagnosis: Radiculopathy, cervical region - Plan: PT plan of care cert/re-cert  Chronic left-sided low back pain without sciatica - Plan: PT plan of care cert/re-cert  Abnormal posture - Plan: PT plan of care cert/re-cert     Problem List Patient Active Problem List   Diagnosis Date Noted   Right shoulder pain 04/16/2020   Thumb pain, right 04/16/2020   Follow up 04/16/2020    04/18/2020 10/19/2020, 12:09 PM  [facility] [address] [place], [place], [place] Phone: [phone]   Fax:  [phone]  Name: Eric Nunez MRN: Marily Lente Date of Birth: Oct 04, 1971

## 2020-11-02 ENCOUNTER — Other Ambulatory Visit: Payer: Self-pay

## 2020-11-02 ENCOUNTER — Ambulatory Visit: Payer: Self-pay | Attending: Physical Medicine and Rehabilitation | Admitting: Physical Therapy

## 2020-11-02 ENCOUNTER — Encounter: Payer: Self-pay | Admitting: Physical Therapy

## 2020-11-02 DIAGNOSIS — G8929 Other chronic pain: Secondary | ICD-10-CM | POA: Insufficient documentation

## 2020-11-02 DIAGNOSIS — M5412 Radiculopathy, cervical region: Secondary | ICD-10-CM | POA: Insufficient documentation

## 2020-11-02 DIAGNOSIS — M545 Low back pain, unspecified: Secondary | ICD-10-CM | POA: Insufficient documentation

## 2020-11-02 DIAGNOSIS — R293 Abnormal posture: Secondary | ICD-10-CM | POA: Insufficient documentation

## 2020-11-02 NOTE — Therapy (Signed)
[facility] Outpatient Rehabilitation [facility] [address] [place], [place], [address] Phone: [phone]   Fax:  [phone]  Physical Therapy Treatment  Patient Details  Name: Eric Nunez MRN: [id number] Date of Birth: 06-Feb-1971 Referring Provider (PT): Dr Florene Route   Encounter Date: 11/02/2020   PT End of Session - 11/02/20 1155    Visit Number 9    Number of Visits 14    Date for PT Re-Evaluation 11/30/20    Authorization Type CAFA    PT Start Time 1015    PT Stop Time 1058    PT Time Calculation (min) 43 min    Activity Tolerance Patient tolerated treatment well    Behavior During Therapy [facility] for tasks assessed/performed           Past Medical History:  Diagnosis Date  . Elevated cholesterol 03/2020  . Elevated TSH 03/2020  . Right shoulder pain 03/2020    Past Surgical History:  Procedure Laterality Date  . APPENDECTOMY    . left knee repair      There were no vitals filed for this visit.   Subjective Assessment - 11/02/20 1024    Subjective Patient continues to have some difficulty with the exercises. He reports some of them cause him fatigue in his shoulder blade.    Currently in Pain? Yes    Pain Score 5     Pain Location Back    Pain Orientation Right    Pain Descriptors / Indicators Aching    Pain Type Chronic pain                             [facility] Adult PT Treatment/Exercise - 11/02/20 0001      Lumbar Exercises: Seated   LAQ on Chair Limitations 3x10 green     Other Seated Lumbar Exercises clamshell 3x10 green  hamstirng curl 3x10 green       Manual Therapy   Manual therapy comments skilled palpation of trigger points     Joint Mobilization grade I and II PA glides from L3-> L5 and aadded mid throacic area today .     Soft tissue mobilization upper trp release/ STM to cervical paraspinal     Manual Traction gentle manual cervical traction; LAD of bilateral LE ; sub-occipital trrelease   Grade 5 minipulation of hip 2x each side             Prayer stretch; lateral prayer 3x20 sec each direction         PT Short Term Goals - 08/24/20 1138      PT SHORT TERM GOAL #1   Title Patient will demonstrate full lumbar flexion without pain    Time 3    Period Weeks    Status New    Target Date 09/14/20      PT SHORT TERM GOAL #2   Title Patient will reports a 50% reduction in numbness into the hand    Time 3    Period Weeks    Status New    Target Date 09/14/20      PT SHORT TERM GOAL #3   Title Patient will be indepdnent with basic stretching and strengthening program    Time 3    Period Weeks    Status New    Target Date 09/14/20             PT Long Term Goals - 08/30/20 1209  PT LONG TERM GOAL #1   Title Patient will sleep on his tright side without numbness    Time 6    Period Weeks    Status New      PT LONG TERM GOAL #2   Title Patient will return to the gym without increased back pain or numbness    Time 6    Period Weeks    Status New                 Plan - 11/02/20 1155    Clinical Impression Statement Patient continues to have intemrittent numbness in his arm. He appears to have less pain overall  but it sitll increases when he does activity. Therapy focused on manual therapy to cervical spine and his lower back today. He was given a prayter stretch today and a general LE strengthening program for home.,    Personal Factors and Comorbidities Profession;Fitness    Examination-Activity Limitations Reach Overhead;Bend;Carry;Lift    Examination-Participation Restrictions Cleaning;Community Activity;Occupation;Laundry    Stability/Clinical Decision Making Evolving/Moderate complexity    Clinical Decision Making Moderate    Rehab Potential Good    PT Frequency 1x / week    PT Duration 6 weeks    PT Treatment/Interventions ADLs/Self Care Home Management;Electrical Stimulation;Cryotherapy;Iontophoresis 4mg /ml Dexamethasone;Moist  Heat;DME Instruction;Functional mobility training;Therapeutic activities;Therapeutic exercise;Neuromuscular re-education;Patient/family education;Manual techniques;Passive range of motion;Dry needling;Taping    PT Next Visit Plan add supine, seated, or standing core exercises, consider PPT    PT Home Exercise Plan Access Code: [email or web address] by: .Seated Hamstring Stretch - 1 x daily - 7 x weekly - 3 sets - 10 reps.Seated Upper Trapezius Stretch - 1 x daily - 7 x weekly - 3 sets - 10 reps.Gentle Levator Scapulae Stretch - 1 x daily - 7 x weekly - 3 sets - 10 reps.Standing Upper Trapezius Mobilization with Small Ball - 1 x daily - 7 x weekly - 2 min hold.Standing Glute Med Mobilization with Small Ball on Wall - 1 x daily - 7 x weekly - 2 min hold    Consulted and Agree with Plan of Care Patient           Patient will benefit from skilled therapeutic intervention in order to improve the following deficits and impairments:  Decreased range of motion, Increased fascial restricitons, Impaired UE functional use, Decreased endurance, Decreased activity tolerance, Decreased mobility, Impaired sensation, Postural dysfunction  Visit Diagnosis: Radiculopathy, cervical region  Chronic left-sided low back pain without sciatica  Abnormal posture     Problem List Patient Active Problem List   Diagnosis Date Noted  . Right shoulder pain 04/16/2020  . Thumb pain, right 04/16/2020  . Follow up 04/16/2020    04/18/2020 PT DPT  11/02/2020, 11:58 AM  [facility] [address] [place], [place], [place] Phone: [phone]   Fax:  [phone]  Name: Eric Nunez MRN: Marily Lente Date of Birth: 1971-09-01

## 2020-11-09 ENCOUNTER — Other Ambulatory Visit: Payer: Self-pay

## 2020-11-09 ENCOUNTER — Encounter: Payer: Self-pay | Admitting: Physical Therapy

## 2020-11-09 ENCOUNTER — Ambulatory Visit: Payer: Self-pay | Admitting: Physical Therapy

## 2020-11-09 DIAGNOSIS — M5412 Radiculopathy, cervical region: Secondary | ICD-10-CM

## 2020-11-09 DIAGNOSIS — R293 Abnormal posture: Secondary | ICD-10-CM

## 2020-11-09 DIAGNOSIS — G8929 Other chronic pain: Secondary | ICD-10-CM

## 2020-11-09 NOTE — Therapy (Signed)
[facility] Outpatient Rehabilitation [facility] [address] [place], [place], [address] Phone: [phone]   Fax:  [phone]  Physical Therapy Treatment  Patient Details  Name: Eric Nunez MRN: [id number] Date of Birth: 21-Feb-1971 Referring Provider (PT): Dr Florene Route    Encounter Date: 11/09/2020   PT End of Session - 11/09/20 0940    Visit Number 10    Number of Visits 14    Date for PT Re-Evaluation 11/30/20    Authorization Type CAFA    PT Start Time 0932    PT Stop Time 1015    PT Time Calculation (min) 43 min    Activity Tolerance Patient tolerated treatment well    Behavior During Therapy [facility] for tasks assessed/performed           Past Medical History:  Diagnosis Date  . Elevated cholesterol 03/2020  . Elevated TSH 03/2020  . Right shoulder pain 03/2020    Past Surgical History:  Procedure Laterality Date  . APPENDECTOMY    . left knee repair      There were no vitals filed for this visit.   Subjective Assessment - 11/09/20 0937    Subjective Patient unloaded 55732 pounds of food this week. He had some help but his shoulder and upper trap are still sore.    Pertinent History left knee debirdement in 1986    Limitations Lifting    How long can you sit comfortably? depends on position but can make his back tight    Diagnostic tests No x-rays in the chart but patient reports he has had x-rays and they are clear    Patient Stated Goals continue to be able to juggle    Currently in Pain? No/denies    Pain Location Back    Pain Orientation Right    Pain Descriptors / Indicators Aching    Pain Type Chronic pain    Pain Onset More than a month ago    Pain Frequency Constant    Aggravating Factors  use of the arm    Pain Relieving Factors come and goes    Effect of Pain on Daily Activities numbness into the arm.                             [facility] Adult PT Treatment/Exercise - 11/09/20 0001       Manual Therapy   Manual therapy comments skilled palpation of trigger points     Joint Mobilization Posterior, anterior, and inferior glides of the shoulder Grade II and III     Soft tissue mobilization trigger point release to the upper trap       Neck Exercises: Stretches   Other Neck Stretches doorway stretch 3x20 sec hold mod cuing for technique; wand overhead stretch 3x20 sec hold                   PT Education - 11/09/20 0940    Person(s) Educated Patient    Methods Explanation;Demonstration;Tactile cues;Verbal cues    Comprehension Verbalized understanding;Returned demonstration;Verbal cues required;Tactile cues required            PT Short Term Goals - 08/24/20 1138      PT SHORT TERM GOAL #1   Title Patient will demonstrate full lumbar flexion without pain    Time 3    Period Weeks    Status New    Target Date 09/14/20      PT  SHORT TERM GOAL #2   Title Patient will reports a 50% reduction in numbness into the hand    Time 3    Period Weeks    Status New    Target Date 09/14/20      PT SHORT TERM GOAL #3   Title Patient will be indepdnent with basic stretching and strengthening program    Time 3    Period Weeks    Status New    Target Date 09/14/20             PT Long Term Goals - 08/30/20 1209      PT LONG TERM GOAL #1   Title Patient will sleep on his tright side without numbness    Time 6    Period Weeks    Status New      PT LONG TERM GOAL #2   Title Patient will return to the gym without increased back pain or numbness    Time 6    Period Weeks    Status New                 Plan - 11/09/20 1005    Clinical Impression Statement Therapy focused on the shoulder today. He reports overhead movement causes increased numbness. We have been focusiung on the neck so we will try the shoulder and see if he has any benefit. He was encouraged to continue with strengthening. he wil;l have some time off coming up.  He could feel the spot  in the back of his shoulder with posterir glides. Therapy also reviewed two stretchs with the patient.    Personal Factors and Comorbidities Profession;Fitness    Examination-Activity Limitations Reach Overhead;Bend;Carry;Lift    Examination-Participation Restrictions Cleaning;Community Activity;Occupation;Laundry    Stability/Clinical Decision Making Evolving/Moderate complexity    Clinical Decision Making Moderate    Rehab Potential Good    PT Frequency 1x / week    PT Duration 6 weeks    PT Treatment/Interventions ADLs/Self Care Home Management;Electrical Stimulation;Cryotherapy;Iontophoresis 4mg /ml Dexamethasone;Moist Heat;DME Instruction;Functional mobility training;Therapeutic activities;Therapeutic exercise;Neuromuscular re-education;Patient/family education;Manual techniques;Passive range of motion;Dry needling;Taping    PT Next Visit Plan add supine, seated, or standing core exercises, consider PPT    PT Home Exercise Plan Access Code: [email or web address] by: .Seated Hamstring Stretch - 1 x daily - 7 x weekly - 3 sets - 10 reps.Seated Upper Trapezius Stretch - 1 x daily - 7 x weekly - 3 sets - 10 reps.Gentle Levator Scapulae Stretch - 1 x daily - 7 x weekly - 3 sets - 10 reps.Standing Upper Trapezius Mobilization with Small Ball - 1 x daily - 7 x weekly - 2 min hold.Standing Glute Med Mobilization with Small Ball on Wall - 1 x daily - 7 x weekly - 2 min hold    Consulted and Agree with Plan of Care Patient           Patient will benefit from skilled therapeutic intervention in order to improve the following deficits and impairments:  Decreased range of motion, Increased fascial restricitons, Impaired UE functional use, Decreased endurance, Decreased activity tolerance, Decreased mobility, Impaired sensation, Postural dysfunction  Visit Diagnosis: Radiculopathy, cervical region  Chronic left-sided low back pain without sciatica  Abnormal posture     Problem  List Patient Active Problem List   Diagnosis Date Noted  . Right shoulder pain 04/16/2020  . Thumb pain, right 04/16/2020  . Follow up 04/16/2020    04/18/2020 PT DPT  11/09/2020, 12:25 PM  [facility] Outpatient Rehabilitation [facility] [address] [place], [place], [address] Phone: [phone]   Fax:  [phone]  Name: Paulo Keimig MRN: [id number] Date of Birth: [birth year]

## 2020-11-13 ENCOUNTER — Encounter: Payer: Self-pay | Admitting: Physical Therapy

## 2020-11-13 ENCOUNTER — Other Ambulatory Visit: Payer: Self-pay

## 2020-11-13 ENCOUNTER — Ambulatory Visit: Payer: Self-pay | Admitting: Physical Therapy

## 2020-11-13 DIAGNOSIS — M5412 Radiculopathy, cervical region: Secondary | ICD-10-CM

## 2020-11-13 DIAGNOSIS — R293 Abnormal posture: Secondary | ICD-10-CM

## 2020-11-13 DIAGNOSIS — G8929 Other chronic pain: Secondary | ICD-10-CM

## 2020-11-13 DIAGNOSIS — M545 Low back pain, unspecified: Secondary | ICD-10-CM

## 2020-11-13 NOTE — Therapy (Signed)
[facility] Outpatient Rehabilitation [facility] [address] [place], [place], [address] Phone: [phone]   Fax:  [phone]  Physical Therapy Treatment  Patient Details  Name: Eric Nunez MRN: [id number] Date of Birth: Apr 30, 1971 Referring Provider (PT): Dr Florene Route   Encounter Date: 11/13/2020   PT End of Session - 11/13/20 0851    Visit Number 11    Number of Visits 14    Date for PT Re-Evaluation 11/30/20    Authorization Type CAFA    PT Start Time 0750    PT Stop Time 0834    PT Time Calculation (min) 44 min    Activity Tolerance Patient tolerated treatment well    Behavior During Therapy [facility] for tasks assessed/performed           Past Medical History:  Diagnosis Date  . Elevated cholesterol 03/2020  . Elevated TSH 03/2020  . Right shoulder pain 03/2020    Past Surgical History:  Procedure Laterality Date  . APPENDECTOMY    . left knee repair      There were no vitals filed for this visit.   Subjective Assessment - 11/13/20 0755    Subjective On Friday the patient had no tingling in his [place]oulder for the first time. 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    Pertinent History left knee debirdement in 1986    Limitations Lifting    How long can you sit comfortably? depends on position but can make his back tight    Diagnostic tests No x-rays in the chart but patient reports he has had x-rays and they are clear    Patient Stated Goals continue to be able to juggle    Currently in Pain? Yes    Pain Score 4     Pain Location Neck    Pain Orientation Right    Pain Descriptors / Indicators Aching    Pain Type Chronic pain    Pain Onset More than a month ago    Pain Frequency Constant    Aggravating Factors  use of the arm    Pain Relieving Factors pain comes and goes    Effect of Pain on Daily Activities numbness in the arm.                             [facility] Adult PT Treatment/Exercise -  11/13/20 0001      Manual Therapy   Manual therapy comments skilled palpation of trigger points     Joint Mobilization Posterior, anterior, and inferior glides of the shoulder Grade II and III     Soft tissue mobilization trigger point release to the upper trap ; IASTYM to levator and upper trap     Manual Traction gentle manual cervical traction; LAD of bilateral LE ; sub-occipital trrelease  Grade 5 minipulation of hip 2x each side       Neck Exercises: Stretches   Other Neck Stretches talked about the doorway stretch at [employer] Needling - 11/13/20 0001    Consent Given? Yes    Muscles Treated Head and Neck Levator scapulae    Upper Trapezius Response Twitch reponse elicited;Palpable increased muscle length    Levator Scapulae Response Twitch response elicited;Palpable increased muscle length                PT Education -  11/13/20 0848    Education Details Reviewed HEP and symptom management    Person(s) Educated Patient    Methods Explanation;Demonstration;Tactile cues;Verbal cues    Comprehension Verbalized understanding;Returned demonstration;Verbal cues required;Tactile cues required            PT Short Term Goals - 11/13/20 1254      PT SHORT TERM GOAL #1   Title Patient will demonstrate full lumbar flexion without pain    Time 3    Period Weeks    Status On-going    Target Date 09/14/20      PT SHORT TERM GOAL #2   Title Patient will reports a 50% reduction in numbness into the hand    Time 3    Period Weeks    Status On-going    Target Date 09/14/20      PT SHORT TERM GOAL #3   Title Patient will be indepdnent with basic stretching and strengthening program    Time 3    Period Weeks    Status On-going    Target Date 09/14/20             PT Long Term Goals - 08/30/20 1209      PT LONG TERM GOAL #1   Title Patient will sleep on his tright side without numbness    Time 6    Period Weeks    Status New      PT  LONG TERM GOAL #2   Title Patient will return to the gym without increased back pain or numbness    Time 6    Period Weeks    Status New                 Plan - 11/13/20 1249    Clinical Impression Statement Therapy focused on manual therapy and needling today. he will have his busiest day of the year at work after his appointment. He was advised to continues stretching. He has been able to sleep at least partially with reduced numbness since starting manual therapy on his shoulder. He continues to have a large trigger point in his levator and upper trap. Patient will have a few days off. He was advised to continue with HEP over his time off. therapy will continue to work on manual therapy and advance exercises as tolerated.    Personal Factors and Comorbidities Profession;Fitness    Examination-Activity Limitations Reach Overhead;Bend;Carry;Lift    Examination-Participation Restrictions Cleaning;Community Activity;Occupation;Laundry    Stability/Clinical Decision Making Evolving/Moderate complexity    Clinical Decision Making Moderate    Rehab Potential Good    PT Frequency 1x / week    PT Duration 6 weeks    PT Treatment/Interventions ADLs/Self Care Home Management;Electrical Stimulation;Cryotherapy;Iontophoresis 4mg /ml Dexamethasone;Moist Heat;DME Instruction;Functional mobility training;Therapeutic activities;Therapeutic exercise;Neuromuscular re-education;Patient/family education;Manual techniques;Passive range of motion;Dry needling;Taping    PT Next Visit Plan add supine, seated, or standing core exercises, consider PPT    PT Home Exercise Plan Access Code: [email or web address] by: .Seated Hamstring Stretch - 1 x daily - 7 x weekly - 3 sets - 10 reps.Seated Upper Trapezius Stretch - 1 x daily - 7 x weekly - 3 sets - 10 reps.Gentle Levator Scapulae Stretch - 1 x daily - 7 x weekly - 3 sets - 10 reps.Standing Upper Trapezius Mobilization with Small Ball - 1 x daily -  7 x weekly - 2 min hold.Standing Glute Med Mobilization with Small Ball on Wall - 1 x daily - 7 x weekly - 2 min  hold    Consulted and Agree with Plan of Care Patient           Patient will benefit from skilled therapeutic intervention in order to improve the following deficits and impairments:  Decreased range of motion, Increased fascial restricitons, Impaired UE functional use, Decreased endurance, Decreased activity tolerance, Decreased mobility, Impaired sensation, Postural dysfunction  Visit Diagnosis: Radiculopathy, cervical region  Chronic left-sided low back pain without sciatica  Abnormal posture     Problem List Patient Active Problem List   Diagnosis Date Noted  . Right shoulder pain 04/16/2020  . Thumb pain, right 04/16/2020  . Follow up 04/16/2020    Dessie Coma PT DPT  11/13/2020, 12:57 PM  [facility] [address] [place], [place], [address] Phone: [phone]   Fax:  [phone]  Name: Allister Lessley MRN: [id number] Date of Birth: 10-26-1971

## 2020-11-23 ENCOUNTER — Ambulatory Visit: Payer: No Typology Code available for payment source | Admitting: Physical Medicine and Rehabilitation

## 2020-11-26 ENCOUNTER — Other Ambulatory Visit: Payer: Self-pay

## 2020-11-26 ENCOUNTER — Ambulatory Visit: Payer: Self-pay | Attending: Physical Medicine and Rehabilitation | Admitting: Physical Therapy

## 2020-11-26 ENCOUNTER — Encounter: Payer: Self-pay | Admitting: Physical Therapy

## 2020-11-26 DIAGNOSIS — R293 Abnormal posture: Secondary | ICD-10-CM | POA: Insufficient documentation

## 2020-11-26 DIAGNOSIS — M5412 Radiculopathy, cervical region: Secondary | ICD-10-CM | POA: Insufficient documentation

## 2020-11-26 DIAGNOSIS — M545 Low back pain, unspecified: Secondary | ICD-10-CM | POA: Insufficient documentation

## 2020-11-26 DIAGNOSIS — G8929 Other chronic pain: Secondary | ICD-10-CM | POA: Insufficient documentation

## 2020-11-26 NOTE — Therapy (Signed)
[facility] [address] [place], [place], [address] Phone: [phone]   Fax:  [phone]  Physical Therapy Treatment  Patient Details  Name: Eric Nunez MRN: [id number] Date of Birth: 12-Jan-1971 Referring Provider (PT): Dr Florene Route   Encounter Date: 11/26/2020   PT End of Session - 11/26/20 1504    Visit Number 12    Number of Visits 14    Date for PT Re-Evaluation 11/30/20    Authorization Type CAFA    PT Start Time 1454    PT Stop Time 1536    PT Time Calculation (min) 42 min    Activity Tolerance Patient tolerated treatment well    Behavior During Therapy [facility] for tasks assessed/performed           Past Medical History:  Diagnosis Date  . Elevated cholesterol 03/2020  . Elevated TSH 03/2020  . Right shoulder pain 03/2020    Past Surgical History:  Procedure Laterality Date  . APPENDECTOMY    . left knee repair      There were no vitals filed for this visit.   Subjective Assessment - 11/26/20 1502    Subjective Patient reports the numbenss continues to improve but it is still there with certain positions. He has not had to lift as much at work.    Pertinent History left knee debirdement in 1986    How long can you sit comfortably? depends on position but can make his back tight    Diagnostic tests No x-rays in the chart but patient reports he has had x-rays and they are clear    Patient Stated Goals continue to be able to juggle    Currently in Pain? Yes    Pain Score 4     Pain Location Neck    Pain Orientation Right    Pain Descriptors / Indicators Aching    Pain Type Chronic pain    Pain Onset More than a month ago    Pain Frequency Constant    Aggravating Factors  use of the arm    Pain Relieving Factors changes with activity    Effect of Pain on Daily Activities numbness int he arm                             [facility] Adult PT Treatment/Exercise - 11/26/20  0001      Manual Therapy   Manual therapy comments skilled palpation of trigger points     Joint Mobilization Posterior, anterior, and inferior glides of the shoulder Grade II and III     Soft tissue mobilization trigger point release to the upper trap ; IASTYM to levator and upper trap     Manual Traction gentle manual cervical traction; LAD of bilateral LE ; sub-occipital trrelease  Grade 5 minipulation of hip 2x each side                   PT Education - 11/26/20 1503    Education Details reviewed core ther-ex    Person(s) Educated Patient    Methods Explanation;Demonstration;Tactile cues;Verbal cues    Comprehension Verbalized understanding;Verbal cues required;Returned demonstration;Tactile cues required            PT Short Term Goals - 11/13/20 1254      PT SHORT TERM GOAL #1   Title Patient will demonstrate full lumbar flexion without pain    Time 3    Period Weeks  Status On-going    Target Date 09/14/20      PT SHORT TERM GOAL #2   Title Patient will reports a 50% reduction in numbness into the hand    Time 3    Period Weeks    Status On-going    Target Date 09/14/20      PT SHORT TERM GOAL #3   Title Patient will be indepdnent with basic stretching and strengthening program    Time 3    Period Weeks    Status On-going    Target Date 09/14/20             PT Long Term Goals - 08/30/20 1209      PT LONG TERM GOAL #1   Title Patient will sleep on his tright side without numbness    Time 6    Period Weeks    Status New      PT LONG TERM GOAL #2   Title Patient will return to the gym without increased back pain or numbness    Time 6    Period Weeks    Status New                 Plan - 11/26/20 1551    Clinical Impression Statement Theray again focused on manual therayp. He had a hard day of work today lifting boxes. He did not feel like he could do much ther-ex. He hs bewen working on  his threr-ex at home. Overall his spasming in  his right shoulder and upper trap is improving. His numbness intensity and frequency ids decreasing.    Examination-Activity Limitations Reach Overhead;Bend;Carry;Lift    Examination-Participation Restrictions Cleaning;Community Activity;Occupation;Laundry    Stability/Clinical Decision Making Evolving/Moderate complexity    Clinical Decision Making Moderate    Rehab Potential Good    PT Frequency 1x / week    PT Duration 6 weeks    PT Treatment/Interventions ADLs/Self Care Home Management;Electrical Stimulation;Cryotherapy;Iontophoresis 4mg /ml Dexamethasone;Moist Heat;DME Instruction;Functional mobility training;Therapeutic activities;Therapeutic exercise;Neuromuscular re-education;Patient/family education;Manual techniques;Passive range of motion;Dry needling;Taping    PT Next Visit Plan add supine, seated, or standing core exercises, consider PPT    PT Home Exercise Plan Access Code: [email or web address] by: .Seated Hamstring Stretch - 1 x daily - 7 x weekly - 3 sets - 10 reps.Seated Upper Trapezius Stretch - 1 x daily - 7 x weekly - 3 sets - 10 reps.Gentle Levator Scapulae Stretch - 1 x daily - 7 x weekly - 3 sets - 10 reps.Standing Upper Trapezius Mobilization with Small Ball - 1 x daily - 7 x weekly - 2 min hold.Standing Glute Med Mobilization with Small Ball on Wall - 1 x daily - 7 x weekly - 2 min hold    Consulted and Agree with Plan of Care Patient           Patient will benefit from skilled therapeutic intervention in order to improve the following deficits and impairments:  Decreased range of motion, Increased fascial restricitons, Impaired UE functional use, Decreased endurance, Decreased activity tolerance, Decreased mobility, Impaired sensation, Postural dysfunction  Visit Diagnosis: Radiculopathy, cervical region  Chronic left-sided low back pain without sciatica  Abnormal posture     Problem List Patient Active Problem List   Diagnosis Date Noted  .  Right shoulder pain 04/16/2020  . Thumb pain, right 04/16/2020  . Follow up 04/16/2020    04/18/2020 PT DPT  11/26/2020, 4:06 PM  [facility] [address]  [place], [place], [address] Phone: [phone]   Fax:  [phone]  Name: Nandan Willems MRN: [id number] Date of Birth: Jan 06, 1971

## 2020-11-30 ENCOUNTER — Encounter: Payer: Self-pay | Admitting: Physical Therapy

## 2020-11-30 ENCOUNTER — Ambulatory Visit: Payer: Self-pay | Admitting: Physical Therapy

## 2020-11-30 ENCOUNTER — Other Ambulatory Visit: Payer: Self-pay

## 2020-11-30 DIAGNOSIS — G8929 Other chronic pain: Secondary | ICD-10-CM

## 2020-11-30 DIAGNOSIS — M5412 Radiculopathy, cervical region: Secondary | ICD-10-CM

## 2020-11-30 DIAGNOSIS — R293 Abnormal posture: Secondary | ICD-10-CM

## 2020-11-30 NOTE — Therapy (Signed)
[facility] [address] [place], [place], [address] Phone: [phone]   Fax:  [phone]  Physical Therapy Treatment  Patient Details  Name: Rubel Heckard MRN: [id number] Date of Birth: [birth year] Referring Provider (PT): Dr Florene Route   Encounter Date: 11/30/2020   PT End of Session - 11/30/20 1237    Visit Number 13    Number of Visits 14    Date for PT Re-Evaluation 11/30/20    Authorization Type CAFA    PT Start Time 1015    PT Stop Time 1058    PT Time Calculation (min) 43 min    Activity Tolerance Patient tolerated treatment well    Behavior During Therapy [facility] for tasks assessed/performed           Past Medical History:  Diagnosis Date  . Elevated cholesterol 03/2020  . Elevated TSH 03/2020  . Right shoulder pain 03/2020    Past Surgical History:  Procedure Laterality Date  . APPENDECTOMY    . left knee repair      There were no vitals filed for this visit.   Subjective Assessment - 11/30/20 1236    Subjective Patient reports his lower back has been sore over the past few days. He has been doing a lot of lifitng. he has been working on his shoulder exercises.    Pertinent History left knee debirdement in 1986    Limitations Lifting    How long can you sit comfortably? depends on position but can make his back tight    Diagnostic tests No x-rays in the chart but patient reports he has had x-rays and they are clear    Patient Stated Goals continue to be able to juggle    Currently in Pain? Yes    Pain Score 4     Pain Location Back    Pain Orientation Left    Pain Descriptors / Indicators Aching    Pain Type Chronic pain    Pain Onset More than a month ago    Pain Frequency Constant    Aggravating Factors  use of arm    Pain Relieving Factors changes in activity    Effect of Pain on Daily Activities numbness in the arm                             [facility] Adult PT  Treatment/Exercise - 11/30/20 0001      Lumbar Exercises: Stretches   Other Lumbar Stretch Exercise reviewed dynamic stretching: deep squats; hamstring slow kicks, anterior hip stretching; and side groin stretching all with dynamic movement      Manual Therapy   Soft tissue mobilization to lumbar spine using IASYTM    Manual Traction LAD biulateral LE grade 4 wtih occilations                  PT Education - 11/30/20 1237    Education Details HEP and symptom mangement    Person(s) Educated Patient    Methods Explanation;Demonstration;Tactile cues;Verbal cues    Comprehension Verbalized understanding;Returned demonstration;Verbal cues required;Tactile cues required            PT Short Term Goals - 11/13/20 1254      PT SHORT TERM GOAL #1   Title Patient will demonstrate full lumbar flexion without pain    Time 3    Period Weeks    Status On-going    Target Date 09/14/20  PT SHORT TERM GOAL #2   Title Patient will reports a 50% reduction in numbness into the hand    Time 3    Period Weeks    Status On-going    Target Date 09/14/20      PT SHORT TERM GOAL #3   Title Patient will be indepdnent with basic stretching and strengthening program    Time 3    Period Weeks    Status On-going    Target Date 09/14/20             PT Long Term Goals - 08/30/20 1209      PT LONG TERM GOAL #1   Title Patient will sleep on his tright side without numbness    Time 6    Period Weeks    Status New      PT LONG TERM GOAL #2   Title Patient will return to the gym without increased back pain or numbness    Time 6    Period Weeks    Status New                 Plan - 11/30/20 1238    Clinical Impression Statement PatiTherapy focused on the patients lower back today. Therapy perfromed IASTYM nad LAED. We also revieweddynamic stretching for lifting. He has a large trigger point in his lwer back. It was better after treatment. He will see the MD next week.     Personal Factors and Comorbidities Profession;Fitness    Examination-Activity Limitations Reach Overhead;Bend;Carry;Lift    Examination-Participation Restrictions Cleaning;Community Activity;Occupation;Laundry    Stability/Clinical Decision Making Evolving/Moderate complexity    Clinical Decision Making Moderate    Rehab Potential Good    PT Frequency 1x / week    PT Duration 6 weeks    PT Treatment/Interventions ADLs/Self Care Home Management;Electrical Stimulation;Cryotherapy;Iontophoresis 4mg /ml Dexamethasone;Moist Heat;DME Instruction;Functional mobility training;Therapeutic activities;Therapeutic exercise;Neuromuscular re-education;Patient/family education;Manual techniques;Passive range of motion;Dry needling;Taping    PT Next Visit Plan add supine, seated, or standing core exercises, consider PPT    PT Home Exercise Plan Access Code: [email or web address] by: .Seated Hamstring Stretch - 1 x daily - 7 x weekly - 3 sets - 10 reps.Seated Upper Trapezius Stretch - 1 x daily - 7 x weekly - 3 sets - 10 reps.Gentle Levator Scapulae Stretch - 1 x daily - 7 x weekly - 3 sets - 10 reps.Standing Upper Trapezius Mobilization with Small Ball - 1 x daily - 7 x weekly - 2 min hold.Standing Glute Med Mobilization with Small Ball on Wall - 1 x daily - 7 x weekly - 2 min hold    Consulted and Agree with Plan of Care Patient           Patient will benefit from skilled therapeutic intervention in order to improve the following deficits and impairments:  Decreased range of motion,Increased fascial restricitons,Impaired UE functional use,Decreased endurance,Decreased activity tolerance,Decreased mobility,Impaired sensation,Postural dysfunction  Visit Diagnosis: Radiculopathy, cervical region  Chronic left-sided low back pain without sciatica  Abnormal posture     Problem List Patient Active Problem List   Diagnosis Date Noted  . Right shoulder pain 04/16/2020  . Thumb pain, right  04/16/2020  . Follow up 04/16/2020    04/18/2020 PT DPT  11/30/2020, 12:43 PM  [facility] [address] [place], [place], [place] Phone: [phone]   Fax:  [phone]  Name: Jovanne Riggenbach MRN: Marily Lente Date of Birth: 19-Apr-1971

## 2020-12-06 ENCOUNTER — Telehealth: Payer: Self-pay | Admitting: Nurse Practitioner

## 2020-12-06 NOTE — Telephone Encounter (Signed)
I return Pt call, he has a financial appt for 12/28/20

## 2020-12-06 NOTE — Telephone Encounter (Signed)
Copied from CRM [id number]. Topic: General - Other >> Dec 05, 2020  2:54 PM Leafy Ro wrote: Reason for CRM: Pt is calling and has an appt on 12/07/2020 with dr Carlis Abbott and would like carlos to call him on the phone to renew his orange card

## 2020-12-07 ENCOUNTER — Encounter: Payer: Self-pay | Admitting: Physical Therapy

## 2020-12-07 ENCOUNTER — Encounter: Payer: Self-pay | Admitting: Physical Medicine and Rehabilitation

## 2020-12-07 ENCOUNTER — Other Ambulatory Visit: Payer: Self-pay

## 2020-12-07 ENCOUNTER — Ambulatory Visit: Payer: Self-pay | Admitting: Physical Therapy

## 2020-12-07 DIAGNOSIS — G8929 Other chronic pain: Secondary | ICD-10-CM

## 2020-12-07 DIAGNOSIS — R293 Abnormal posture: Secondary | ICD-10-CM

## 2020-12-07 DIAGNOSIS — M5412 Radiculopathy, cervical region: Secondary | ICD-10-CM

## 2020-12-09 ENCOUNTER — Encounter: Payer: Self-pay | Admitting: Physical Therapy

## 2020-12-09 NOTE — Therapy (Signed)
[facility] [address] [place], [place], [address] Phone: [phone]   Fax:  [phone]  Physical Therapy Treatment  Patient Details  Name: Cordelle Dahmen MRN: [id number] Date of Birth: 1971-04-20 Referring Provider (PT): Dr Florene Route   Encounter Date: 12/07/2020   PT End of Session - 12/09/20 0955    Visit Number 14    Date for PT Re-Evaluation 11/30/20    Authorization Type CAFA    PT Start Time 1100    PT Stop Time 1140    PT Time Calculation (min) 40 min    Activity Tolerance Patient tolerated treatment well    Behavior During Therapy [facility] for tasks assessed/performed           Past Medical History:  Diagnosis Date  . Elevated cholesterol 03/2020  . Elevated TSH 03/2020  . Right shoulder pain 03/2020    Past Surgical History:  Procedure Laterality Date  . APPENDECTOMY    . left knee repair      There were no vitals filed for this visit.                      [facility] Adult PT Treatment/Exercise - 12/09/20 0001      Lumbar Exercises: Standing   Other Standing Lumbar Exercises lateral band walks 2x10 green; monster walk 2x10 each leg green      Manual Therapy   Soft tissue mobilization IASTYM to upper trap and cervical paraspinals      Neck Exercises: Stretches   Other Neck Stretches reviewed mulligan towel mobilizations in all planes. reviewed how to stabilizae specific jjoints; also reviewed self traction. PAteint advised to toover stretch; he feels like he is doing too much of the upper trap and levatro stretching                    PT Short Term Goals - 11/13/20 1254      PT SHORT TERM GOAL #1   Title Patient will demonstrate full lumbar flexion without pain    Time 3    Period Weeks    Status On-going    Target Date 09/14/20      PT SHORT TERM GOAL #2   Title Patient will reports a 50% reduction in numbness into the hand    Time 3    Period Weeks     Status On-going    Target Date 09/14/20      PT SHORT TERM GOAL #3   Title Patient will be indepdnent with basic stretching and strengthening program    Time 3    Period Weeks    Status On-going    Target Date 09/14/20             PT Long Term Goals - 08/30/20 1209      PT LONG TERM GOAL #1   Title Patient will sleep on his tright side without numbness    Time 6    Period Weeks    Status New      PT LONG TERM GOAL #2   Title Patient will return to the gym without increased back pain or numbness    Time 6    Period Weeks    Status New                 Plan - 12/09/20 0955    Clinical Impression Statement We will re-assess patient next visit. He continues to have pain in his  upper trap. He was givn a differnt set of stretching that decompresses. We are reaching outr max potential for therapy . He has several exercises for UE and LE strengthening but he will have to be patient. he does a loit of lifting at work. He will bring in all of his exercises next visit and we will review which ones work for him and which ones dont. He will likely D/C in the next 2-3 visits. We will re-assess next visit.    Personal Factors and Comorbidities Profession;Fitness    Examination-Activity Limitations Reach Overhead;Bend;Carry;Lift    Examination-Participation Restrictions Cleaning;Community Activity;Occupation;Laundry    Stability/Clinical Decision Making Evolving/Moderate complexity    Clinical Decision Making Moderate    Rehab Potential Good    PT Frequency 1x / week    PT Duration 6 weeks    PT Next Visit Plan add supine, seated, or standing core exercises, consider PPT    PT Home Exercise Plan Access Code: [email or web address] by: Alric Seton.Seated Hamstring Stretch - 1 x daily - 7 x weekly - 3 sets - 10 reps.Seated Upper Trapezius Stretch - 1 x daily - 7 x weekly - 3 sets - 10 reps.Gentle Levator Scapulae Stretch - 1 x daily - 7 x weekly - 3 sets - 10 reps.Standing Upper  Trapezius Mobilization with Small Ball - 1 x daily - 7 x weekly - 2 min hold.Standing Glute Med Mobilization with Small Ball on Wall - 1 x daily - 7 x weekly - 2 min hold    Consulted and Agree with Plan of Care Patient           Patient will benefit from skilled therapeutic intervention in order to improve the following deficits and impairments:  Decreased range of motion,Increased fascial restricitons,Impaired UE functional use,Decreased endurance,Decreased activity tolerance,Decreased mobility,Impaired sensation,Postural dysfunction  Visit Diagnosis: Radiculopathy, cervical region  Chronic left-sided low back pain without sciatica  Abnormal posture     Problem List Patient Active Problem List   Diagnosis Date Noted  . Right shoulder pain 04/16/2020  . Thumb pain, right 04/16/2020  . Follow up 04/16/2020    Dessie Coma PT DPT  12/09/2020, 9:59 AM  [facility] [address] [place], [place], [address] Phone: [phone]   Fax:  [phone]  Name: Jasaiah Karwowski MRN: [id number] Date of Birth: 1971-05-25

## 2020-12-10 ENCOUNTER — Other Ambulatory Visit: Payer: Self-pay

## 2020-12-10 ENCOUNTER — Encounter: Payer: Self-pay | Admitting: Physical Therapy

## 2020-12-10 ENCOUNTER — Ambulatory Visit: Payer: Self-pay | Admitting: Physical Therapy

## 2020-12-10 DIAGNOSIS — M5412 Radiculopathy, cervical region: Secondary | ICD-10-CM

## 2020-12-10 DIAGNOSIS — M545 Low back pain, unspecified: Secondary | ICD-10-CM

## 2020-12-10 DIAGNOSIS — R293 Abnormal posture: Secondary | ICD-10-CM

## 2020-12-11 NOTE — Therapy (Signed)
[facility] [address] [place], [place], [address] Phone: [phone]   Fax:  [phone]  Physical Therapy Treatment  Patient Details  Name: Eric Nunez MRN: [id number] Date of Birth: [birth year] Referring Provider (PT): Dr Florene Route   Encounter Date: 12/10/2020   PT End of Session - 12/10/20 1510    Visit Number 14    Number of Visits 18    Date for PT Re-Evaluation 01/14/21    Authorization Type CAFA    PT Start Time 1500    PT Stop Time 1540    PT Time Calculation (min) 40 min    Activity Tolerance Patient tolerated treatment well    Behavior During Therapy [facility] for tasks assessed/performed           Past Medical History:  Diagnosis Date  . Elevated cholesterol 03/2020  . Elevated TSH 03/2020  . Right shoulder pain 03/2020    Past Surgical History:  Procedure Laterality Date  . APPENDECTOMY    . left knee repair      There were no vitals filed for this visit.   Subjective Assessment - 12/10/20 1508    Subjective Patient reportshe has been tired. He has been doing a lot of extra work. He reports his upper trap has been better but he is just tired.    Pertinent History left knee debirdement in 1986    Limitations Lifting    How long can you sit comfortably? depends on position but can make his back tight    Diagnostic tests No x-rays in the chart but patient reports he has had x-rays and they are clear    Patient Stated Goals continue to be able to juggle    Currently in Pain? Yes    Pain Score 3     Pain Location Neck    Pain Orientation Right    Pain Descriptors / Indicators Aching    Pain Type Chronic pain    Pain Onset More than a month ago    Pain Frequency Constant    Aggravating Factors  use of the arm    Pain Relieving Factors change in activity    Effect of Pain on Daily Activities Numbness in the arm                             [facility] Adult PT  Treatment/Exercise - 12/11/20 0001      Self-Care   Other Self-Care Comments  reviewed entire upper back and lower back HEP. Reviewed how to use stretches vs exercises. he was advised to further pair down his stretches to the ones that are better for him. Also talked to him about advancement of exercises.      Neck Exercises: Seated   Other Seated Exercise bileteral er green x15; horixontal abduction green x15; abduction with flexion x15 green      Lumbar Exercises: Stretches   Active Hamstring Stretch Left;2 reps;30 seconds    Active Hamstring Stretch Limitations seated with cuing     Lower Trunk Rotation Limitations 10 with leg cross    Piriformis Stretch Limitations 2x20 sec hold bilateral      Lumbar Exercises: Standing   Other Standing Lumbar Exercises lateral band walks 2x10 green; monster walk 2x10 each leg green      Lumbar Exercises: Supine   Clam Limitations black x20 with abdominal brreathing    Bent Knee Raise Limitations reviewed with progression. Advaised  not to progress until able      Manual Therapy   Soft tissue mobilization IASTYM to upper trap and cervical paraspinals      Neck Exercises: Stretches   Upper Trapezius Stretch 2 reps;20 seconds;Left    Levator Stretch 2 reps;20 seconds;Left    Other Neck Stretches reviewed mulligan towel placement again and self traction                  PT Education - 12/10/20 1509    Education Details HEP and symptom mangement    Person(s) Educated Patient    Methods Explanation;Demonstration;Tactile cues;Verbal cues    Comprehension Verbalized understanding;Returned demonstration;Verbal cues required;Tactile cues required            PT Short Term Goals - 11/13/20 1254      PT SHORT TERM GOAL #1   Title Patient will demonstrate full lumbar flexion without pain    Time 3    Period Weeks    Status On-going    Target Date 09/14/20      PT SHORT TERM GOAL #2   Title Patient will reports a 50% reduction in  numbness into the hand    Time 3    Period Weeks    Status On-going    Target Date 09/14/20      PT SHORT TERM GOAL #3   Title Patient will be indepdnent with basic stretching and strengthening program    Time 3    Period Weeks    Status On-going    Target Date 09/14/20             PT Long Term Goals - 08/30/20 1209      PT LONG TERM GOAL #1   Title Patient will sleep on his tright side without numbness    Time 6    Period Weeks    Status New      PT LONG TERM GOAL #2   Title Patient will return to the gym without increased back pain or numbness    Time 6    Period Weeks    Status New                 Plan - 12/10/20 1622    Clinical Impression Statement Patient is nearing max benefit for rehab. Therapy reviewed both his neck and back programs. His arm is mostly numb when he lies on it. He has not been able to follow up with the MD 2nd to finacial reasons. He continues to do heavey lifting at work so he is having some trouble consitently doing his exercises. He has a few weeks off coming up soon. During that time we will review hevier lifting activity and continue to propare him for D/C to HEP.    Personal Factors and Comorbidities Profession;Fitness    Examination-Activity Limitations Reach Overhead;Bend;Carry;Lift    Examination-Participation Restrictions Cleaning;Community Activity;Occupation;Laundry    Stability/Clinical Decision Making Evolving/Moderate complexity    Rehab Potential Good    PT Frequency 1x / week    PT Duration 6 weeks    PT Treatment/Interventions ADLs/Self Care Home Management;Electrical Stimulation;Cryotherapy;Iontophoresis 4mg /ml Dexamethasone;Moist Heat;DME Instruction;Functional mobility training;Therapeutic activities;Therapeutic exercise;Neuromuscular re-education;Patient/family education;Manual techniques;Passive range of motion;Dry needling;Taping    PT Next Visit Plan add supine, seated, or standing core exercises, consider PPT    PT  Home Exercise Plan Access Code: [email or web address] by: .Seated Hamstring Stretch - 1 x daily - 7 x weekly - 3 sets - 10 reps.Seated Upper Trapezius Stretch -  1 x daily - 7 x weekly - 3 sets - 10 reps.Gentle Levator Scapulae Stretch - 1 x daily - 7 x weekly - 3 sets - 10 reps.Standing Upper Trapezius Mobilization with Small Ball - 1 x daily - 7 x weekly - 2 min hold.Standing Glute Med Mobilization with Small Ball on Wall - 1 x daily - 7 x weekly - 2 min hold    Consulted and Agree with Plan of Care Patient           Patient will benefit from skilled therapeutic intervention in order to improve the following deficits and impairments:  Decreased range of motion,Increased fascial restricitons,Impaired UE functional use,Decreased endurance,Decreased activity tolerance,Decreased mobility,Impaired sensation,Postural dysfunction  Visit Diagnosis: Radiculopathy, cervical region  Chronic left-sided low back pain without sciatica  Abnormal posture     Problem List Patient Active Problem List   Diagnosis Date Noted  . Right shoulder pain 04/16/2020  . Thumb pain, right 04/16/2020  . Follow up 04/16/2020    Dessie Coma PT DPT 12/11/2020, 8:41 AM  [facility] [address] [place], [place], [address] Phone: [phone]   Fax:  [phone]  Name: Eric Nunez MRN: [id number] Date of Birth: 06-09-1971

## 2020-12-17 ENCOUNTER — Other Ambulatory Visit: Payer: Self-pay

## 2020-12-17 ENCOUNTER — Encounter: Payer: Self-pay | Admitting: Physical Therapy

## 2020-12-17 ENCOUNTER — Ambulatory Visit: Payer: Self-pay | Admitting: Physical Therapy

## 2020-12-17 DIAGNOSIS — M545 Low back pain, unspecified: Secondary | ICD-10-CM

## 2020-12-17 DIAGNOSIS — R293 Abnormal posture: Secondary | ICD-10-CM

## 2020-12-17 DIAGNOSIS — M5412 Radiculopathy, cervical region: Secondary | ICD-10-CM

## 2020-12-18 ENCOUNTER — Encounter: Payer: Self-pay | Admitting: Physical Therapy

## 2020-12-18 NOTE — Therapy (Signed)
[facility] [address] [place], [place], [address] Phone: [phone]   Fax:  [phone]  Physical Therapy Treatment  Patient Details  Name: Eric Nunez MRN: [id number] Date of Birth: 10-Oct-1971 Referring Provider (PT): Dr Florene Route   Encounter Date: 12/17/2020   PT End of Session - 12/17/20 1647    Visit Number 15    Number of Visits 18    Date for PT Re-Evaluation 01/14/21    Authorization Type CAFA    PT Start Time 1500    PT Stop Time 1542    PT Time Calculation (min) 42 min    Activity Tolerance Patient tolerated treatment well    Behavior During Therapy [facility] for tasks assessed/performed           Past Medical History:  Diagnosis Date  . Elevated cholesterol 03/2020  . Elevated TSH 03/2020  . Right shoulder pain 03/2020    Past Surgical History:  Procedure Laterality Date  . APPENDECTOMY    . left knee repair      There were no vitals filed for this visit.   Subjective Assessment - 12/17/20 1506    Subjective Patient had a tough day of work last week. He has been tired since. He has not been able to rest as much has hoped. His back hads been doing alirght. His neck continsues to hurt.    Pertinent History left knee debirdement in 1986    How long can you sit comfortably? depends on position but can make his back tight    Diagnostic tests No x-rays in the chart but patient reports he has had x-rays and they are clear    Patient Stated Goals continue to be able to juggle    Currently in Pain? Yes    Pain Score 5     Pain Location Neck    Pain Orientation Right    Pain Descriptors / Indicators Aching    Pain Type Chronic pain    Pain Onset More than a month ago    Pain Frequency Constant    Aggravating Factors  use of the arm    Pain Relieving Factors change in activity    Effect of Pain on Daily Activities pain in the neck that increase at time.                              [facility] Adult PT Treatment/Exercise - 12/18/20 0001      Neck Exercises: Standing   Other Standing Exercises scap retraction blue x20; shoulder extension blue x20; shopulder IR green x20; SShoulder ER red cx20      Manual Therapy   Soft tissue mobilization IASTYM to upper trap and cervical paraspinals    Manual Traction gentle to cervical spine                  PT Education - 12/17/20 1646    Education Details reviewed HEP and symptom management    Person(s) Educated Patient    Methods Explanation;Demonstration;Tactile cues;Verbal cues    Comprehension Verbalized understanding;Returned demonstration;Tactile cues required;Verbal cues required            PT Short Term Goals - 11/13/20 1254      PT SHORT TERM GOAL #1   Title Patient will demonstrate full lumbar flexion without pain    Time 3    Period Weeks    Status On-going    Target  Date 09/14/20      PT SHORT TERM GOAL #2   Title Patient will reports a 50% reduction in numbness into the hand    Time 3    Period Weeks    Status On-going    Target Date 09/14/20      PT SHORT TERM GOAL #3   Title Patient will be indepdnent with basic stretching and strengthening program    Time 3    Period Weeks    Status On-going    Target Date 09/14/20             PT Long Term Goals - 08/30/20 1209      PT LONG TERM GOAL #1   Title Patient will sleep on his tright side without numbness    Time 6    Period Weeks    Status New      PT LONG TERM GOAL #2   Title Patient will return to the gym without increased back pain or numbness    Time 6    Period Weeks    Status New                 Plan - 12/17/20 1647    Clinical Impression Statement Patient continues to have limited tolerance to UE exercises. Therapy attmepted to have patient do isolated ER today. he has done bilateral er on several ocasions but had difficulty with isolation with the green band. We continue to  try to progress exercises as tolerated. He continues to have a spasm    Personal Factors and Comorbidities Profession;Fitness    Examination-Activity Limitations Reach Overhead;Bend;Carry;Lift    Examination-Participation Restrictions Cleaning;Community Activity;Occupation;Laundry    Stability/Clinical Decision Making Evolving/Moderate complexity    Clinical Decision Making Moderate    Rehab Potential Good    PT Frequency 1x / week    PT Duration 6 weeks    PT Treatment/Interventions ADLs/Self Care Home Management;Electrical Stimulation;Cryotherapy;Iontophoresis 4mg /ml Dexamethasone;Moist Heat;DME Instruction;Functional mobility training;Therapeutic activities;Therapeutic exercise;Neuromuscular re-education;Patient/family education;Manual techniques;Passive range of motion;Dry needling;Taping    PT Next Visit Plan add supine, seated, or standing core exercises, consider PPT    PT Home Exercise Plan Access Code: [email or web address] by: .Seated Hamstring Stretch - 1 x daily - 7 x weekly - 3 sets - 10 reps.Seated Upper Trapezius Stretch - 1 x daily - 7 x weekly - 3 sets - 10 reps.Gentle Levator Scapulae Stretch - 1 x daily - 7 x weekly - 3 sets - 10 reps.Standing Upper Trapezius Mobilization with Small Ball - 1 x daily - 7 x weekly - 2 min hold.Standing Glute Med Mobilization with Small Ball on Wall - 1 x daily - 7 x weekly - 2 min hold    Consulted and Agree with Plan of Care Patient           Patient will benefit from skilled therapeutic intervention in order to improve the following deficits and impairments:  Decreased range of motion,Increased fascial restricitons,Impaired UE functional use,Decreased endurance,Decreased activity tolerance,Decreased mobility,Impaired sensation,Postural dysfunction  Visit Diagnosis: Radiculopathy, cervical region  Chronic left-sided low back pain without sciatica  Abnormal posture     Problem List Patient Active Problem List    Diagnosis Date Noted  . Right shoulder pain 04/16/2020  . Thumb pain, right 04/16/2020  . Follow up 04/16/2020    04/18/2020 PT DPT  12/18/2020, 11:37 AM  [facility] [address] [place], [place], [place] Phone: [phone]   Fax:  [phone]  Name: Eric Nunez MRN: [id number] Date of Birth: 1971/07/26

## 2020-12-28 ENCOUNTER — Other Ambulatory Visit: Payer: Self-pay

## 2020-12-28 ENCOUNTER — Ambulatory Visit: Payer: Self-pay | Attending: Physical Medicine and Rehabilitation | Admitting: Physical Therapy

## 2020-12-28 ENCOUNTER — Ambulatory Visit: Payer: Self-pay | Attending: Nurse Practitioner

## 2020-12-28 ENCOUNTER — Encounter: Payer: Self-pay | Admitting: Physical Therapy

## 2020-12-28 DIAGNOSIS — R293 Abnormal posture: Secondary | ICD-10-CM | POA: Insufficient documentation

## 2020-12-28 DIAGNOSIS — M545 Low back pain, unspecified: Secondary | ICD-10-CM | POA: Insufficient documentation

## 2020-12-28 DIAGNOSIS — G8929 Other chronic pain: Secondary | ICD-10-CM | POA: Insufficient documentation

## 2020-12-28 DIAGNOSIS — M5412 Radiculopathy, cervical region: Secondary | ICD-10-CM | POA: Insufficient documentation

## 2020-12-28 NOTE — Therapy (Signed)
[facility] [address] [place], [place], [address] Phone: [phone]   Fax:  [phone]  Physical Therapy Treatment  Patient Details  Name: Eric Nunez MRN: [id number] Date of Birth: [birth year] Referring Provider (PT): Dr Florene Route   Encounter Date: 12/28/2020   PT End of Session - 12/28/20 1317    Visit Number 16    Number of Visits 18    Date for PT Re-Evaluation 01/14/21    Authorization Type CAFA    PT Start Time 1015    PT Stop Time 1053    PT Time Calculation (min) 38 min    Activity Tolerance Patient tolerated treatment well    Behavior During Therapy [facility] for tasks assessed/performed           Past Medical History:  Diagnosis Date  . Elevated cholesterol 03/2020  . Elevated TSH 03/2020  . Right shoulder pain 03/2020    Past Surgical History:  Procedure Laterality Date  . APPENDECTOMY    . left knee repair      There were no vitals filed for this visit.   Subjective Assessment - 12/28/20 1022    Subjective Patient is back to work. He cointinues to rpeort upper trap pain. He feels hie is doing some of his exercises wrong. He would like to review.    Pertinent History left knee debirdement in 1986    How long can you sit comfortably? depends on position but can make his back tight    Diagnostic tests No x-rays in the chart but patient reports he has had x-rays and they are clear    Patient Stated Goals continue to be able to juggle    Currently in Pain? No/denies   No pain at this time                            [facility] Adult PT Treatment/Exercise - 12/28/20 0001      Self-Care   Other Self-Care Comments  therapy went through all patients exercises reviewed progressions, progression of bands and technique.                  PT Education - 12/28/20 1317    Education Details reviewed HEP and symptom management    Person(s) Educated Patient    Methods  Explanation;Tactile cues;Verbal cues;Demonstration    Comprehension Verbal cues required;Tactile cues required;Returned demonstration;Verbalized understanding            PT Short Term Goals - 11/13/20 1254      PT SHORT TERM GOAL #1   Title Patient will demonstrate full lumbar flexion without pain    Time 3    Period Weeks    Status On-going    Target Date 09/14/20      PT SHORT TERM GOAL #2   Title Patient will reports a 50% reduction in numbness into the hand    Time 3    Period Weeks    Status On-going    Target Date 09/14/20      PT SHORT TERM GOAL #3   Title Patient will be indepdnent with basic stretching and strengthening program    Time 3    Period Weeks    Status On-going    Target Date 09/14/20             PT Long Term Goals - 08/30/20 1209      PT LONG TERM GOAL #1  Title Patient will sleep on his tright side without numbness    Time 6    Period Weeks    Status New      PT LONG TERM GOAL #2   Title Patient will return to the gym without increased back pain or numbness    Time 6    Period Weeks    Status New                 Plan - 12/28/20 1318    Clinical Impression Statement Therapy reviewed complete HEP including teachnique and psoture with all exercises. We also reviewed how to use the exercises and when to use particular exercises. His HEP was combined into one HEP.    Personal Factors and Comorbidities Profession;Fitness    Examination-Activity Limitations Reach Overhead;Bend;Carry;Lift    Examination-Participation Restrictions Cleaning;Community Activity;Occupation;Laundry    Stability/Clinical Decision Making Evolving/Moderate complexity    Clinical Decision Making Moderate    Rehab Potential Good    PT Frequency 1x / week    PT Duration 6 weeks    PT Treatment/Interventions ADLs/Self Care Home Management;Electrical Stimulation;Cryotherapy;Iontophoresis 4mg /ml Dexamethasone;Moist Heat;DME Instruction;Functional mobility  training;Therapeutic activities;Therapeutic exercise;Neuromuscular re-education;Patient/family education;Manual techniques;Passive range of motion;Dry needling;Taping    PT Next Visit Plan add supine, seated, or standing core exercises, consider PPT    PT Home Exercise Plan Access Code: [email or web address] by: .Seated Hamstring Stretch - 1 x daily - 7 x weekly - 3 sets - 10 reps.Seated Upper Trapezius Stretch - 1 x daily - 7 x weekly - 3 sets - 10 reps.Gentle Levator Scapulae Stretch - 1 x daily - 7 x weekly - 3 sets - 10 reps.Standing Upper Trapezius Mobilization with Small Ball - 1 x daily - 7 x weekly - 2 min hold.Standing Glute Med Mobilization with Small Ball on Wall - 1 x daily - 7 x weekly - 2 min hold    Consulted and Agree with Plan of Care Patient           Patient will benefit from skilled therapeutic intervention in order to improve the following deficits and impairments:  Decreased range of motion,Increased fascial restricitons,Impaired UE functional use,Decreased endurance,Decreased activity tolerance,Decreased mobility,Impaired sensation,Postural dysfunction  Visit Diagnosis: Radiculopathy, cervical region  Chronic left-sided low back pain without sciatica  Abnormal posture     Problem List Patient Active Problem List   Diagnosis Date Noted  . Right shoulder pain 04/16/2020  . Thumb pain, right 04/16/2020  . Follow up 04/16/2020    04/18/2020 PT DPT  12/28/2020, 1:21 PM  [facility] [address] [place], [place], [place] Phone: [phone]   Fax:  [phone]  Name: Sye Schroepfer MRN: Marily Lente Date of Birth: [birth year]

## 2021-01-04 ENCOUNTER — Ambulatory Visit: Payer: Self-pay | Admitting: Physical Therapy

## 2021-01-11 ENCOUNTER — Ambulatory Visit: Payer: Self-pay | Admitting: Physical Therapy

## 2021-01-11 ENCOUNTER — Encounter: Payer: Self-pay | Admitting: Physical Therapy

## 2021-01-11 ENCOUNTER — Other Ambulatory Visit: Payer: Self-pay

## 2021-01-11 DIAGNOSIS — M5412 Radiculopathy, cervical region: Secondary | ICD-10-CM

## 2021-01-11 DIAGNOSIS — R293 Abnormal posture: Secondary | ICD-10-CM

## 2021-01-11 DIAGNOSIS — M545 Low back pain, unspecified: Secondary | ICD-10-CM

## 2021-01-11 NOTE — Therapy (Addendum)
[place], [place], [address] Phone: [phone]   Fax:  [phone]  Physical Therapy Treatment/Discharge   Patient Details  Name: Eric Nunez MRN: [id number] Date of Birth: Jan 22, 1971 Referring Provider (PT): Dr Jinger Neighbors   Encounter Date: 01/11/2021   PT End of Session - 01/11/21 1058    Visit Number 17    Number of Visits 18    Date for PT Re-Evaluation 01/14/21    Authorization Type CAFA    PT Start Time 1018    PT Stop Time 1103    PT Time Calculation (min) 45 min    Activity Tolerance Patient tolerated treatment well    Behavior During Therapy [facility] for tasks assessed/performed           Past Medical History:  Diagnosis Date  . Elevated cholesterol 03/2020  . Elevated TSH 03/2020  . Right shoulder pain 03/2020    Past Surgical History:  Procedure Laterality Date  . APPENDECTOMY    . left knee repair      There were no vitals filed for this visit.   Subjective Assessment - 01/11/21 1055    Subjective Patient has not worked much over the past few weeks so he is feeling a little better. He has been working on his exercises. He feels like sometimes he is sore in differentmuscle groups.    Pertinent History left knee debirdement in 1986    Limitations Lifting    How long can you sit comfortably? depends on position but can make his back tight    Diagnostic tests No x-rays in the chart but patient reports he has had x-rays and they are clear    Patient Stated Goals continue to be able to juggle    Currently in Pain? No/denies                             [facility] Adult PT Treatment/Exercise - 01/11/21 0001      Self-Care   Other Self-Care Comments  reviewd exercising to build muscle and progression of weights as he goes along.      Lumbar Exercises: Stretches   Lower Trunk Rotation 5 reps    Lower Trunk Rotation Limitations 10 with leg cross     Piriformis Stretch Limitations 2x20 sec hold bilateral      Lumbar Exercises: Machines for Strengthening   Other Lumbar Machine Exercise row 35 2x0 both handles; lat pull down 2x10 25 pounds; cybex leg press 3 x10 60 lbs      Lumbar Exercises: Supine   Other Supine Lumbar Exercises bridge with band x20; supine march without touching x20                  PT Education - 01/11/21 1057    Education Details reviewed gym equipment    Methods Explanation;Demonstration;Tactile cues;Verbal cues    Comprehension Verbalized understanding;Returned demonstration;Verbal cues required;Tactile cues required            PT Short Term Goals - 11/13/20 1254      PT SHORT TERM GOAL #1   Title Patient will demonstrate full lumbar flexion without pain    Time 3    Period Weeks    Status On-going    Target Date 09/14/20      PT SHORT TERM GOAL #2   Title Patient will reports a 50% reduction in numbness into the hand  Time 3    Period Weeks    Status On-going    Target Date 09/14/20      PT SHORT TERM GOAL #3   Title Patient will be indepdnent with basic stretching and strengthening program    Time 3    Period Weeks    Status On-going    Target Date 09/14/20             PT Long Term Goals - 08/30/20 1209      PT LONG TERM GOAL #1   Title Patient will sleep on his tright side without numbness    Time 6    Period Weeks    Status New      PT LONG TERM GOAL #2   Title Patient will return to the gym without increased back pain or numbness    Time 6    Period Weeks    Status New                 Plan - 01/11/21 1121    Clinical Impression Statement Therapy reviewed qudruped progression and gym exercises. He tolerated well. he feels like he has paltued with the bands. He was advised he may need to go to the gym. We will continue to reviuew gym exercises next visit in preperation for D/C    Personal Factors and Comorbidities Profession;Fitness    Examination-Activity  Limitations Reach Overhead;Bend;Carry;Lift    Examination-Participation Restrictions Cleaning;Community Activity;Occupation;Laundry    Stability/Clinical Decision Making Evolving/Moderate complexity    Rehab Potential Good    PT Frequency 1x / week    PT Duration 6 weeks    PT Treatment/Interventions ADLs/Self Care Home Management;Electrical Stimulation;Cryotherapy;Iontophoresis 41m/ml Dexamethasone;Moist Heat;DME Instruction;Functional mobility training;Therapeutic activities;Therapeutic exercise;Neuromuscular re-education;Patient/family education;Manual techniques;Passive range of motion;Dry needling;Taping    PT Next Visit Plan add supine, seated, or standing core exercises, consider PPT    PT Home Exercise Plan Access Code: [email or web address] by: DOren BracketSeated Hamstring Stretch - 1 x daily - 7 x weekly - 3 sets - 10 reps.Seated Upper Trapezius Stretch - 1 x daily - 7 x weekly - 3 sets - 10 reps.Gentle Levator Scapulae Stretch - 1 x daily - 7 x weekly - 3 sets - 10 reps.Standing Upper Trapezius Mobilization with Small Ball - 1 x daily - 7 x weekly - 2 min hold.Standing Glute Med Mobilization with Small Ball on Wall - 1 x daily - 7 x weekly - 2 min hold    Consulted and Agree with Plan of Care Patient           Patient will benefit from skilled therapeutic intervention in order to improve the following deficits and impairments:  Decreased range of motion,Increased fascial restricitons,Impaired UE functional use,Decreased endurance,Decreased activity tolerance,Decreased mobility,Impaired sensation,Postural dysfunction  Visit Diagnosis: Radiculopathy, cervical region  Abnormal posture  Chronic left-sided low back pain without sciatica    PHYSICAL THERAPY DISCHARGE SUMMARY  Visits from Start of Care: 17  Current functional level related to goals / functional outcomes: Reached max psotential for therapy. Improved ability to exercises and manage pain    Remaining  deficits: Still has pain with work   EScientist, physiological/ Equipment: HEP   Plan: Patient agrees to discharge.  Patient goals were partially met. Patient is being discharged due to being pleased with the current functional level.  ?????      Problem List Patient Active Problem List   Diagnosis Date Noted  . Right shoulder pain 04/16/2020  . Thumb pain, right 04/16/2020  .  Follow up 04/16/2020    Carney Living PT DPT  01/11/2021, 11:29 AM  [facility] [address] [place], [place], [address] Phone: [phone]   Fax:  [phone]  Name: Eric Nunez MRN: [id number] Date of Birth: 1971-02-22

## 2021-01-18 ENCOUNTER — Encounter: Payer: Self-pay | Admitting: Physical Medicine and Rehabilitation

## 2021-01-18 ENCOUNTER — Ambulatory Visit: Payer: Self-pay | Admitting: Physical Therapy

## 2021-04-29 ENCOUNTER — Other Ambulatory Visit: Payer: Self-pay

## 2021-04-29 ENCOUNTER — Emergency Department (HOSPITAL_COMMUNITY): Payer: Self-pay

## 2021-04-29 ENCOUNTER — Emergency Department (HOSPITAL_COMMUNITY)
Admission: EM | Admit: 2021-04-29 | Discharge: 2021-04-29 | Disposition: A | Discharge status: Home / Self Care | Payer: Self-pay | Attending: Emergency Medicine | Admitting: Emergency Medicine

## 2021-04-29 ENCOUNTER — Encounter (HOSPITAL_COMMUNITY): Payer: Self-pay

## 2021-04-29 DIAGNOSIS — S9031XA Contusion of right foot, initial encounter: Secondary | ICD-10-CM | POA: Insufficient documentation

## 2021-04-29 DIAGNOSIS — W208XXA Other cause of strike by thrown, projected or falling object, initial encounter: Secondary | ICD-10-CM | POA: Insufficient documentation

## 2021-04-29 HISTORY — DX: Unspecified hearing loss, unspecified ear: H91.90

## 2021-04-29 NOTE — ED Provider Notes (Signed)
Emergency Medicine Provider Triage Evaluation Note  Eric Nunez , a 50 y.o. male  was evaluated in triage.  Pt complains of r foot pain.  Patient states yesterday he dropped a weight on his foot.  He had worsening pain today, difficulty bearing weight due to pain.  He took 1 500 mg acetaminophen, has not taken anything else.  Pain does not radiate.  No numbness or tingling.  No injury or pain elsewhere.  Review of Systems  Positive: r foot pain Negative: numbness  Physical Exam  BP (!) 146/99 (BP Location: Right Arm)   Pulse 84   Temp 98.7 F (37.1 C) (Oral)   Resp 17   Ht 5\' 9"  (1.753 m)   Wt 106.6 kg   SpO2 99%   BMI 34.70 kg/m  Gen:   Awake, no distress   Resp:  Normal effort  MSK:   Tenderness palpation of the distal fourth metatarsal of the right foot.  No obvious deformity or swelling.  No contusions.  No tenderness palpation of the toes.  Pedal pulses 2+.  Good distal sensation and cap refill.  Full active range of motion of the ankle without difficulty.   Medical Decision Making  Medically screening exam initiated at 3:15 PM.  Appropriate orders placed.  Eric Nunez was informed that the remainder of the evaluation will be completed by another provider, this initial triage assessment does not replace that evaluation, and the importance of remaining in the ED until their evaluation is complete.  Right foot pain after dropping something on it yesterday.  Will obtain x-rays.   Radford Pax, PA-C 04/29/21 1516    06/29/21, MD 04/29/21 2234

## 2021-04-29 NOTE — ED Triage Notes (Signed)
Patient states he was moving stuff in a shed and either dropped some weights or kicked something in the shed with his right foot. Patient states he had increased pain in his right foot near the 4th toe.

## 2021-04-29 NOTE — Discharge Instructions (Signed)
You can take Tylenol or Ibuprofen as directed for pain. You can alternate Tylenol and Ibuprofen every 4 hours. If you take Tylenol at 1pm, then you can take Ibuprofen at 5pm. Then you can take Tylenol again at 9pm.   Follow the RICE (Rest, Ice, Compression, Elevation) protocol as directed.   Follow-up with referred orthopedic doctor for further evaluation if her symptoms persist or get worse.  Return the emergency department for any worsening pain, discoloration of foot, numbness/weakness or any other worsening concerning symptoms.

## 2021-04-29 NOTE — Progress Notes (Signed)
Orthopedic Tech Progress Note Patient Details:  Eric Nunez [facility] 09/20/1971 [id number]  Ortho Devices Type of Ortho Device: CAM walker,Crutches Ortho Device/Splint Location: Right Foot Ortho Device/Splint Interventions: Application,Adjustment   Post Interventions Patient Tolerated: Well,Ambulated well   Eric Nunez E [name] 04/29/2021, 8:27 PM

## 2021-04-29 NOTE — ED Provider Notes (Signed)
[facility]-EMERGENCY DEPT Provider Note   CSN: [id number] Arrival date & time: 04/29/21  1444     History Chief Complaint  Patient presents with  . Foot Pain    Eric Nunez is a 50 y.o. male who presents for evaluation of right foot pain.  He reports yesterday, he dropped a weight that weighed about 25 pounds on his foot.  He states that it hurt a little yesterday when he woke up this morning, his pain is worse.  He states that he has had difficulty bearing weight secondary to pain.  He states most of the pain is centered to the dorsal surface of his foot particularly under the fourth MTP joint.  He states if he does not move it or bear weight on it, does not hurt.  Pain does not radiate.  No numbness/weakness.  He is on a blood thinners.  He took Tylenol for pain.  The history is provided by the patient.       Past Medical History:  Diagnosis Date  . Deafness    right ear  . Elevated cholesterol 03/2020  . Elevated TSH 03/2020  . Right shoulder pain 03/2020    Patient Active Problem List   Diagnosis Date Noted  . Right shoulder pain 04/16/2020  . Thumb pain, right 04/16/2020  . Follow up 04/16/2020    Past Surgical History:  Procedure Laterality Date  . APPENDECTOMY    . left knee repair         Family History  Problem Relation Age of Onset  . Diabetes Mother   . Diabetes Father     Social History   Tobacco Use  . Smoking status: Never Smoker  . Smokeless tobacco: Never Used  Vaping Use  . Vaping Use: Never used  Substance Use Topics  . Alcohol use: Yes    Comment: rare  . Drug use: Not Currently    Home Medications Prior to Admission medications   Medication Sig Start Date End Date Taking? Authorizing Provider  HYDROcodone-acetaminophen (NORCO/VICODIN) 5-325 MG tablet Take 1 tablet by mouth every 6 (six) hours as needed for moderate pain.    [provider]  ibuprofen (ADVIL) 600 MG tablet Take 1 tablet  (600 mg total) by mouth every 6 (six) hours as needed. 09/21/20   Raulkar, Drema Pry, MD  Multiple Vitamin (MULTIVITAMIN WITH MINERALS) TABS tablet Take 1 tablet by mouth daily.    [provider]    Allergies    Patient has no known allergies.  Review of Systems   Review of Systems  Musculoskeletal:       Foot pain  Neurological: Negative for weakness and numbness.  All other systems reviewed and are negative.   Physical Exam Updated Vital Signs BP (!) 146/99 (BP Location: Right Arm)   Pulse 84   Temp 98.7 F (37.1 C) (Oral)   Resp 17   Ht 5\' 9"  (1.753 m)   Wt 106.6 kg   SpO2 99%   BMI 34.70 kg/m   Physical Exam Vitals and nursing note reviewed.  Constitutional:      Appearance: He is well-developed.  HENT:     Head: Normocephalic and atraumatic.  Eyes:     General: No scleral icterus.       Right eye: No discharge.        Left eye: No discharge.     Conjunctiva/sclera: Conjunctivae normal.  Cardiovascular:     Pulses:  Dorsalis pedis pulses are 2+ on the right side and 2+ on the left side.  Pulmonary:     Effort: Pulmonary effort is normal.  Musculoskeletal:     Comments: Tenderness palpation noted dorsal surface of the right foot particularly under the fourth MTP.  No deformity or crepitus noted.  He can wiggle all 5 digits with any difficulty.  No bony tenderness noted to the ankle, tib-fib, proximal tib-fib.  Dorsiflexion and plantarflexion intact.  No palpable deficit on the Achilles tendon.  No tenderness palpation noted to the left upper extremity.  Skin:    General: Skin is warm and dry.     Capillary Refill: Capillary refill takes less than 2 seconds.     Comments: Good distal cap refill. RLE is not dusky in appearance or cool to touch.  Neurological:     Mental Status: He is alert.     Comments: Sensation intact along major nerve distributions of BUE  Psychiatric:        Speech: Speech normal.        Behavior: Behavior normal.      ED Results / Procedures / Treatments   Labs (all labs ordered are listed, but only abnormal results are displayed) Labs Reviewed - No data to display  EKG None  Radiology DG Foot Complete Right  Result Date: 04/29/2021 CLINICAL DATA:  Dropped box on foot yesterday. Pain at fourth proximal interphalangeal joint. EXAM: RIGHT FOOT COMPLETE - 3+ VIEW COMPARISON:  None. FINDINGS: Tiny Achilles spur. No acute fracture or dislocation. No significant soft tissue swelling. IMPRESSION: No acute osseous abnormality. Electronically Signed   By: Jeronimo Greaves M.D.   On: 04/29/2021 15:48    Procedures Procedures   Medications Ordered in ED Medications - No data to display  ED Course  I have reviewed the triage vital signs and the nursing notes.  Pertinent labs & imaging results that were available during my care of the patient were reviewed by me and considered in my medical decision making (see chart for details).    MDM Rules/Calculators/A&P                          50 year old male who presents for evaluation of right foot pain.  Reports dropping weight on it.  Reports today pain worsened and had difficulty ambulating bearing weight.  No numbness/weakness.  No blood thinner use.  On initial arrival, he is afebrile nontoxic-appearing.  Vital signs are stable.  On exam, he has 2+ DP pulses.  He has tenderness noted to the dorsal surface of his right foot at the fourth MTP joint.  No deformity or crepitus noted.  Concern for sprain versus contusion versus fracture.  X-rays ordered at triage.  X-rays reviewed.  No acute bony abnormality.  There is a tiny Achilles spur but no other acute abnormalities.  Discussed results with patient.  We will plan to place him in a cam walker boot.  Patient encouraged on at home supportive care measures.  Patient given outpatient Ortho/podiatry referral to follow-up if he continues to have symptoms.  Portions of this note were generated with Administrator, sports. Dictation errors may occur despite best attempts at proofreading.   Final Clinical Impression(s) / ED Diagnoses Final diagnoses:  Contusion of right foot, initial encounter    Rx / DC Orders ED Discharge Orders    None       Maxwell Caul, PA-C 04/29/21 2357  Gwyneth Sprout, MD 05/02/21 [phone]

## 2021-10-24 IMAGING — DX DG SHOULDER 2+V*R*
1 series · 1 of 1 positions shown · non-contrast
Comparison: None.

CLINICAL DATA: Acute right shoulder pain one month.  No injury.

EXAM:
RIGHT SHOULDER - 2+ VIEW

[shoulder axial]
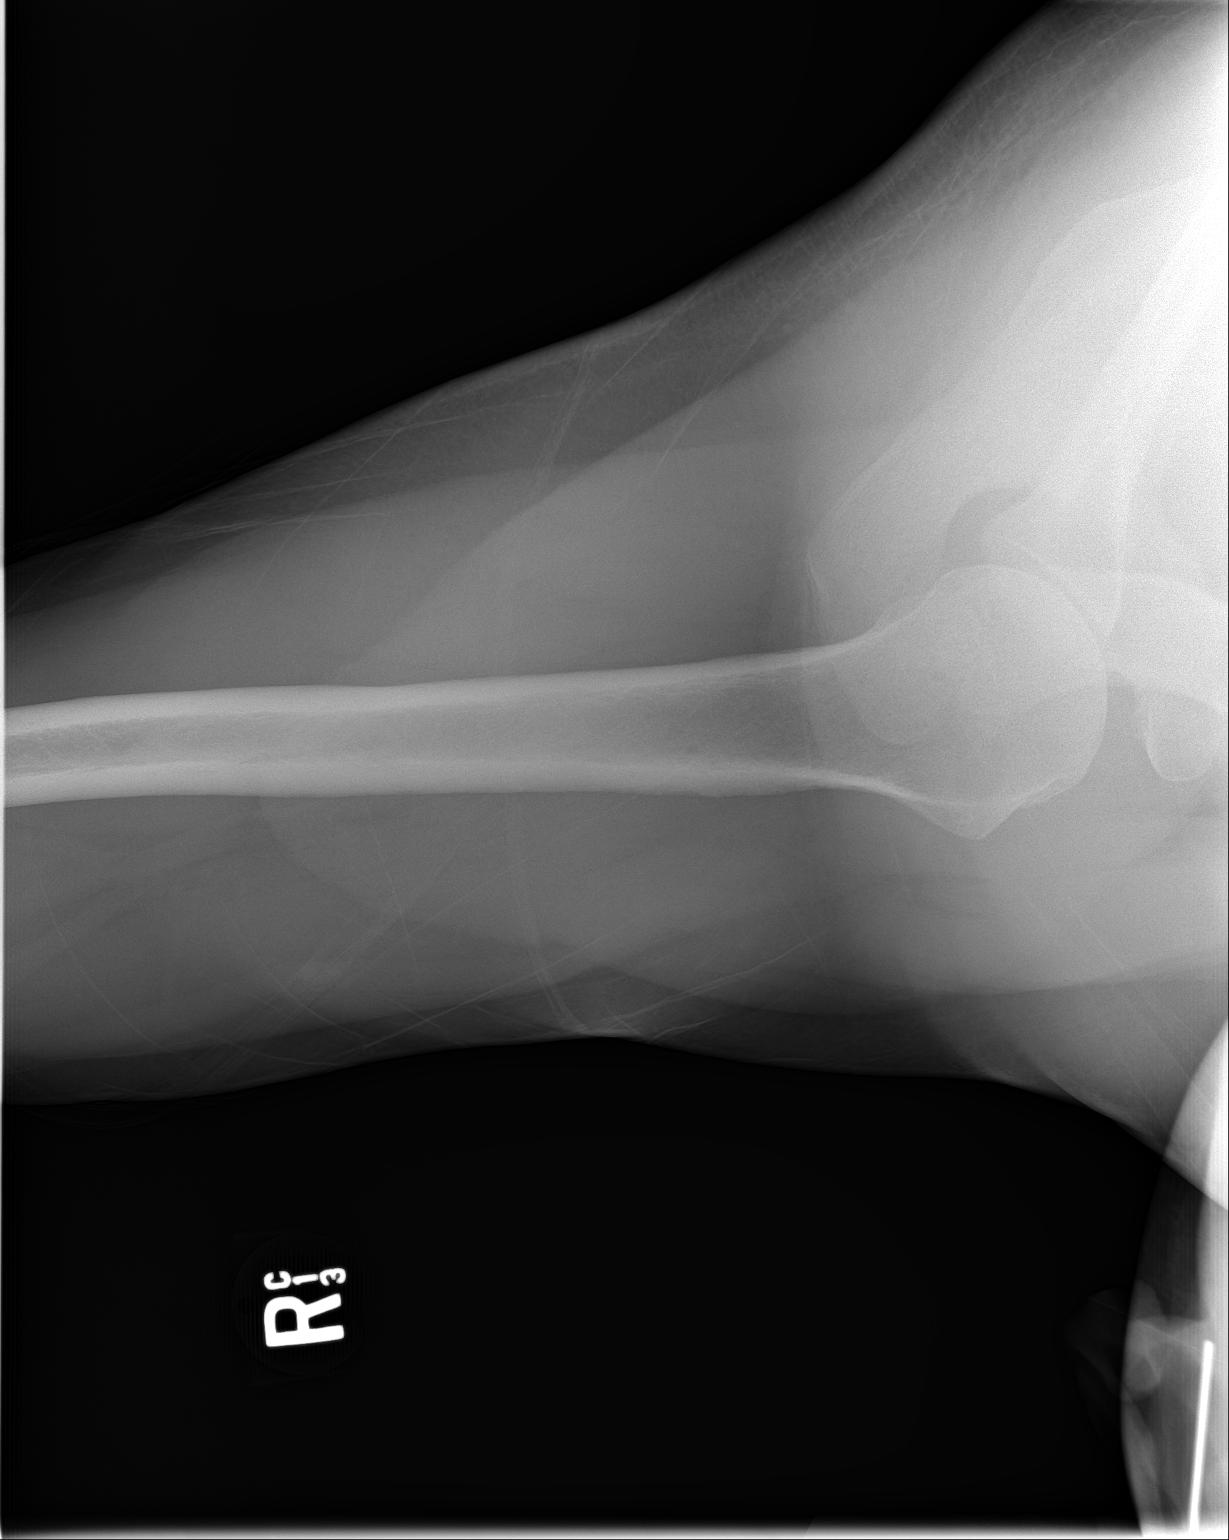

[1 of 1 positions shown; findings below may reference images not displayed]

FINDINGS: Minimal degenerative change of the AC joint and glenohumeral joints.
No evidence of acute fracture or dislocation. No focal bony
abnormality.
IMPRESSION: No acute findings.  Mild degenerative changes as described.

## 2022-11-06 IMAGING — CR DG FOOT COMPLETE 3+V*R*
3 series · 3 of 3 positions shown · non-contrast
Comparison: None.

CLINICAL DATA: Dropped box on foot yesterday. Pain at fourth
proximal interphalangeal joint.

EXAM:
RIGHT FOOT COMPLETE - 3+ VIEW

[x foot ap right]
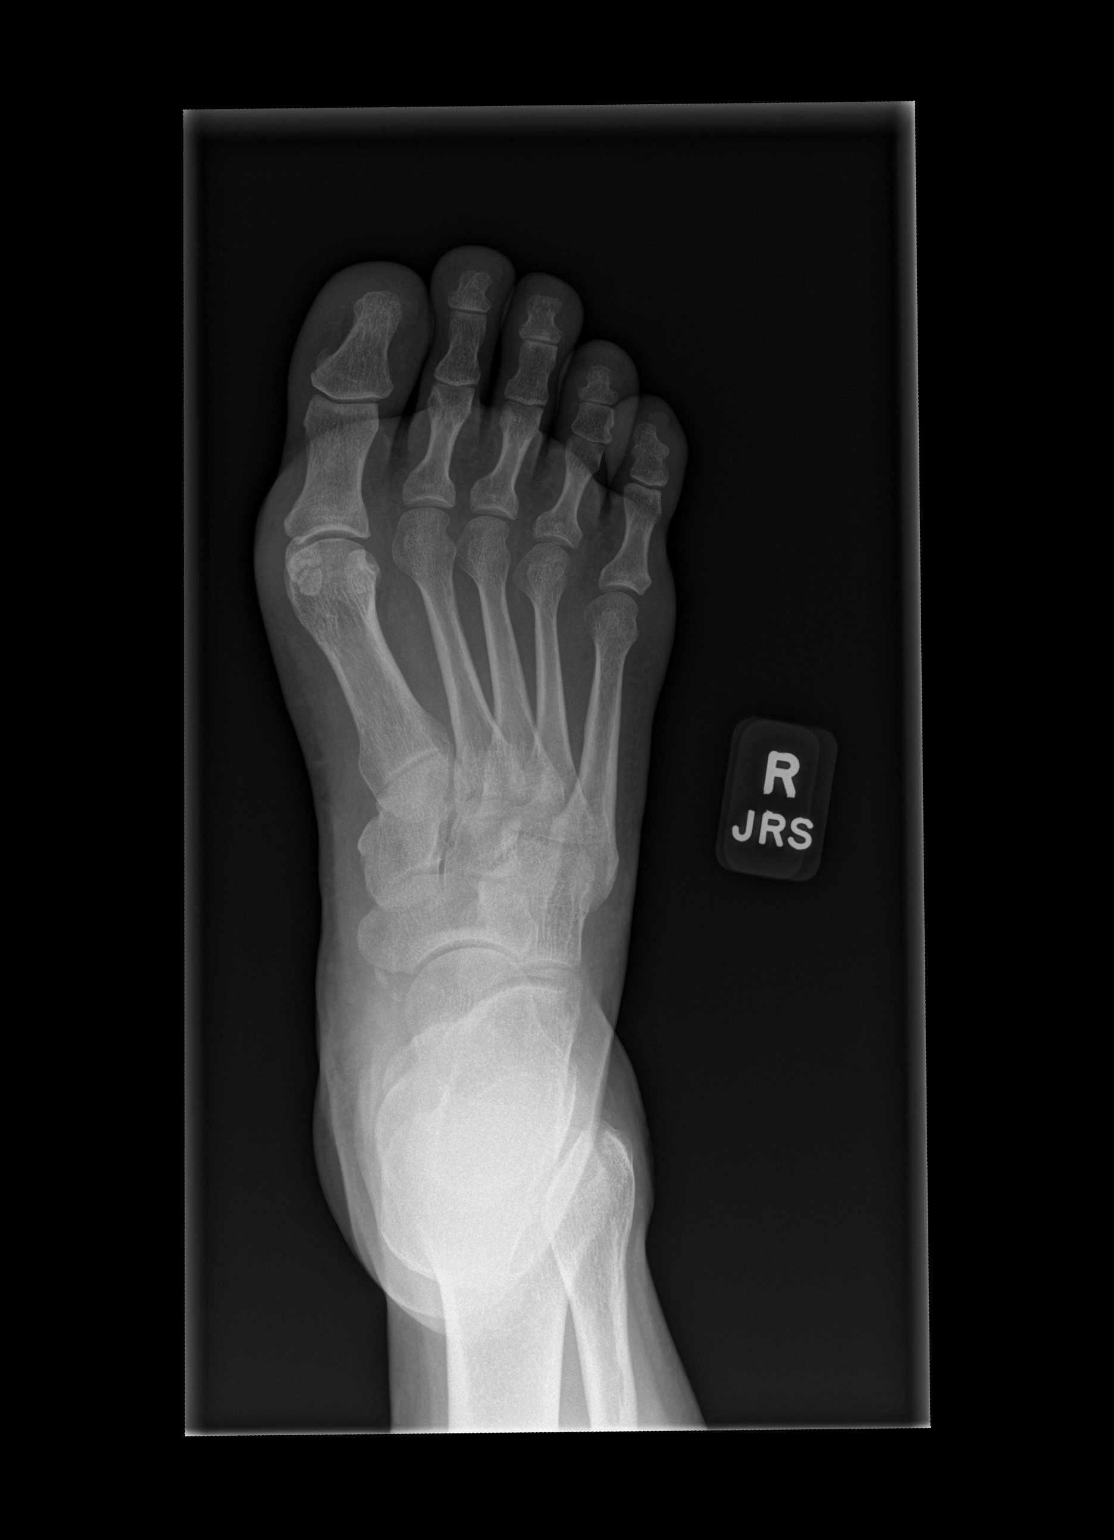

[x foot obl right]
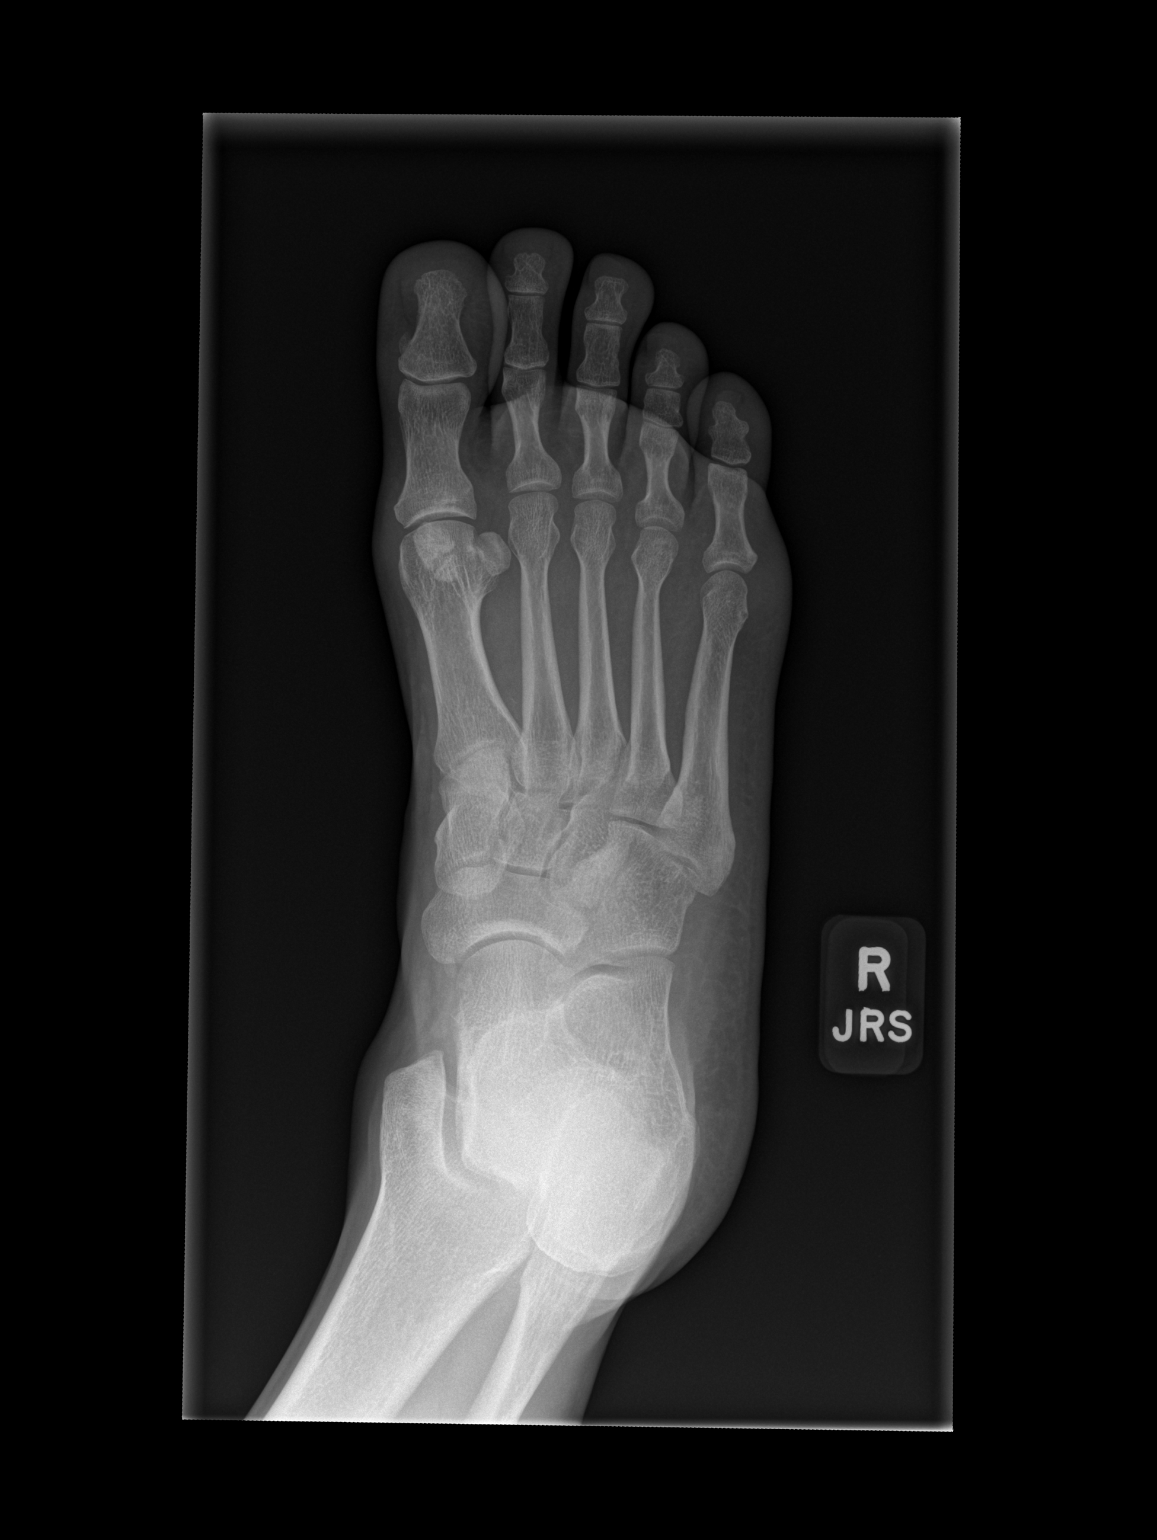

[x foot lat right]
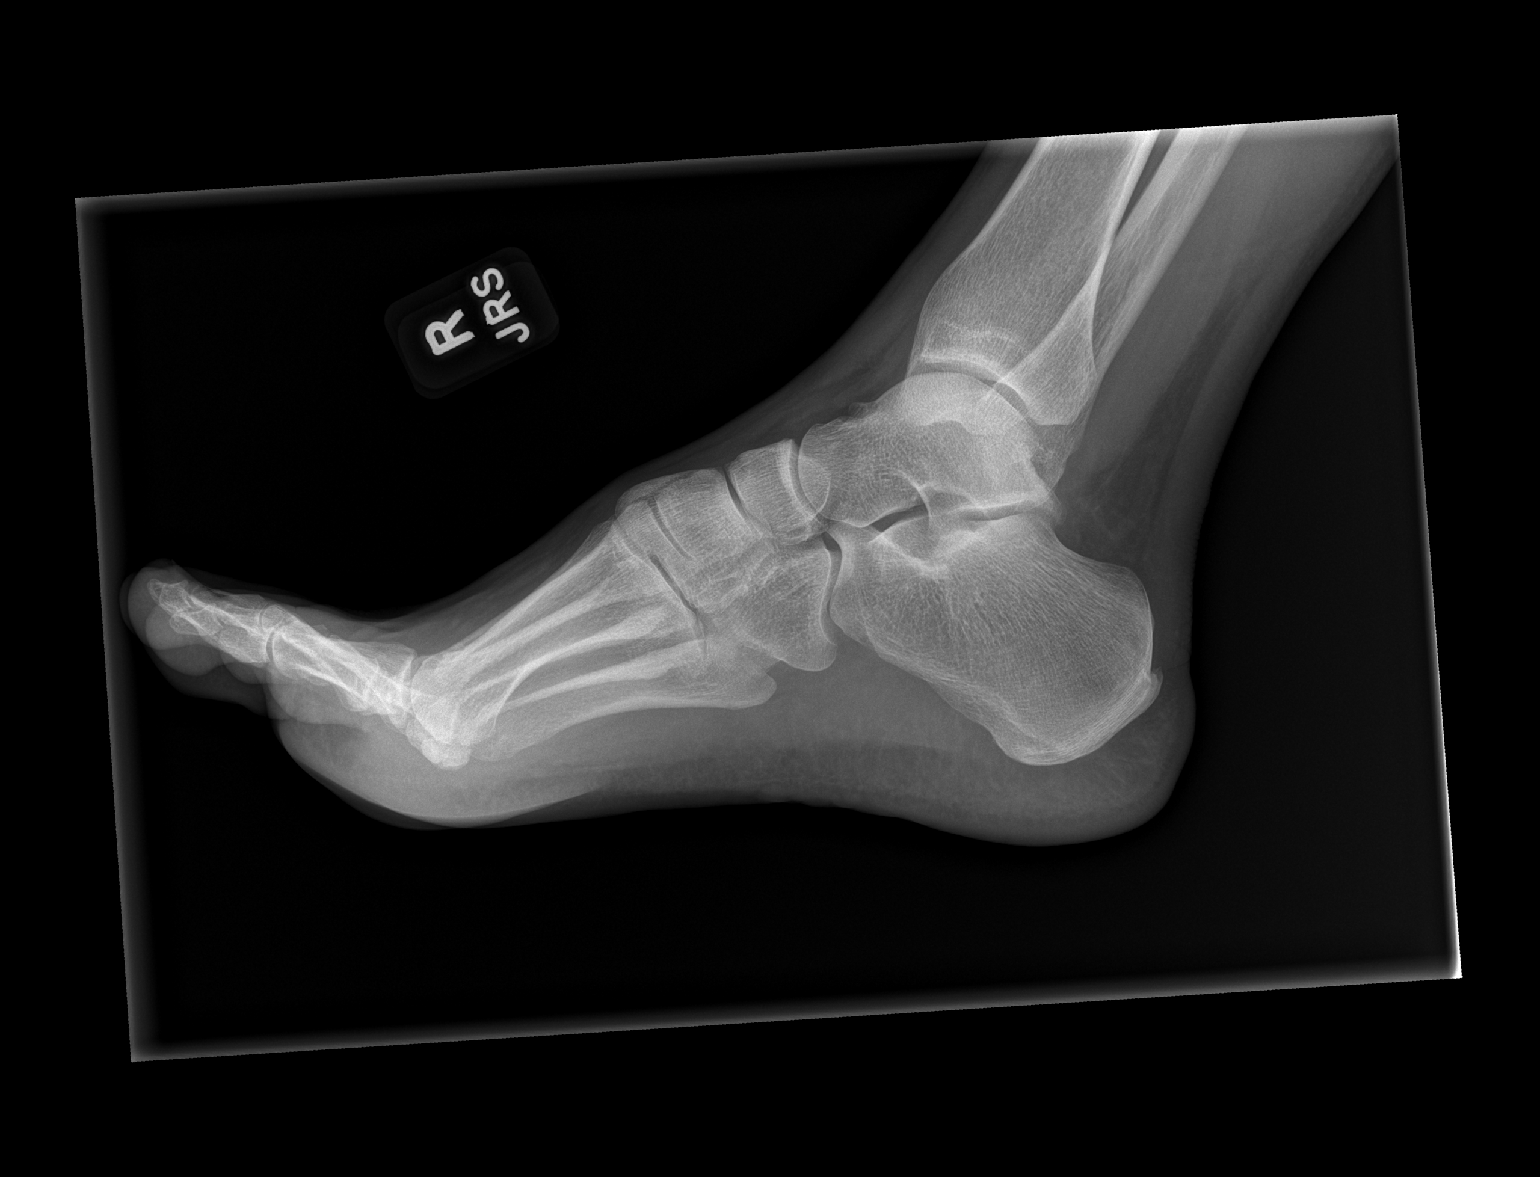

[3 of 3 positions shown; findings below may reference images not displayed]

FINDINGS: Tiny Achilles spur. No acute fracture or dislocation. No significant
soft tissue swelling.
IMPRESSION: No acute osseous abnormality.

## 2024-02-19 ENCOUNTER — Other Ambulatory Visit (INDEPENDENT_AMBULATORY_CARE_PROVIDER_SITE_OTHER): Payer: Self-pay

## 2024-02-19 ENCOUNTER — Ambulatory Visit (INDEPENDENT_AMBULATORY_CARE_PROVIDER_SITE_OTHER): Payer: No Typology Code available for payment source | Admitting: Orthopaedic Surgery

## 2024-02-19 DIAGNOSIS — M542 Cervicalgia: Secondary | ICD-10-CM

## 2024-02-19 DIAGNOSIS — G8929 Other chronic pain: Secondary | ICD-10-CM

## 2024-02-19 DIAGNOSIS — M25511 Pain in right shoulder: Secondary | ICD-10-CM

## 2024-02-19 MED ORDER — METHOCARBAMOL 750 MG PO TABS: 750.0000 mg | ORAL_TABLET | Freq: Two times a day (BID) | ORAL | 1 refills | Status: DC | PRN

## 2024-02-19 MED ORDER — PREDNISONE 10 MG (21) PO TBPK: ORAL_TABLET | ORAL | 2 refills | Status: DC

## 2024-02-19 NOTE — Progress Notes (Signed)
 Office Visit Note   Patient: Eric Nunez           Date of Birth: March 28, 1971           MRN: [id number] Visit Date: 02/19/2024              Requested by: Dot Been, FNP [address] [place],  [place] [address] PCP: Claiborne Rigg, NP   Assessment & Plan: Visit Diagnoses:  1. Neck pain   2. 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  We have discussed starting him on a steroid pack and muscle relaxer as well as sending him to outpatient physical therapy.  He is agreeable to this plan.  He will follow-up with [place] as needed.  Call with concerns or questions.  Follow-Up Instructions: Return if symptoms worsen or fail to improve.   Orders:  Orders Placed This Encounter  Procedures   XR Shoulder Right   XR Cervical Spine 2 or 3 views   Ambulatory referral to Physical Therapy   Meds ordered this encounter  Medications   predniSONE (STERAPRED UNI-PAK 21 TAB) 10 MG (21) TBPK tablet    Sig: Take as directed    Dispense:  21 tablet    Refill:  2   methocarbamol (ROBAXIN-750) 750 MG tablet    Sig: Take 1 tablet (750 mg total) by mouth 2 (two) times daily as needed for muscle spasms.    Dispense:  20 tablet    Refill:  1      Procedures: No procedures performed   Clinical Data: No additional findings.   Subjective: Chief Complaint  Patient presents with   Right Shoulder - Pain   Neck - Pain    HPI patient is a 53 year old gentleman who comes in today with right sided neck and shoulder pain.  Symptoms have been ongoing for about 6 to 7 years.  He says around the time the pain started he had grabbed a heavy case of canned food and felt a pulling sensation in the right lateral neck and shoulder.  He has had increased pain since.  The pain he has is to the right lateral neck and radiates to  the shoulder.  More recently, he has had pain radiating into the forearm.  He tells me his arm goes numb when he is lying on his right side or sometimes when he is lying supine.  He notes his right arm seems to fatigue easily.  He has been taking Tylenol and NSAIDs without significant relief.  Back in 2019, he tells me he went to physical therapy but this did not improve things.  It sounds like his physical therapy was actually for his shoulder at that time not his neck.  Review of Systems as detailed in HPI.  All others reviewed and are negative.   Objective: Vital Signs: There were no vitals taken for this visit.  Physical Exam well-developed well-nourished gentleman in no acute distress.  Alert and oriented x 3.  Ortho Exam right shoulder exam: Unremarkable.  Cervical spine exam: Increased pain with flexion and rotation.  Mild spinous tenderness.  No paraspinous tenderness.  He is neurovascularly intact distally.  Specialty Comments:  No specialty comments available.  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  Straightening of the cervical spine.    PMFS History: Patient Active Problem List   Diagnosis Date Noted   Right shoulder pain 04/16/2020   Thumb pain, right 04/16/2020   Encounter for follow-up 04/16/2020   Past Medical History:  Diagnosis Date   Deafness    right ear   Elevated cholesterol 03/2020   Elevated TSH 03/2020   Right shoulder pain 03/2020    Family History  Problem Relation Age of Onset   Diabetes Mother    Diabetes Father     Past Surgical History:  Procedure Laterality Date   APPENDECTOMY     left knee repair     Social History   Occupational History   Not on file  Tobacco Use   Smoking status: Never   Smokeless tobacco: Never  Vaping Use   Vaping  status: Never Used  Substance and Sexual Activity   Alcohol use: Yes    Comment: rare   Drug use: Not Currently   Sexual activity: Not Currently

## 2024-02-22 ENCOUNTER — Telehealth: Payer: Self-pay | Admitting: Orthopaedic Surgery

## 2024-02-22 NOTE — Telephone Encounter (Signed)
 Received vm from patient requesting his records be faxed to [facility]. IC, lmvm advised he will need to come by office and complete authorization to release records.pts ph [phone]

## 2024-03-11 ENCOUNTER — Other Ambulatory Visit: Payer: Self-pay

## 2024-03-11 ENCOUNTER — Ambulatory Visit: Attending: Physician Assistant

## 2024-03-11 DIAGNOSIS — R293 Abnormal posture: Secondary | ICD-10-CM | POA: Diagnosis present

## 2024-03-11 DIAGNOSIS — M542 Cervicalgia: Secondary | ICD-10-CM | POA: Insufficient documentation

## 2024-03-11 DIAGNOSIS — M6281 Muscle weakness (generalized): Secondary | ICD-10-CM | POA: Insufficient documentation

## 2024-03-11 NOTE — Therapy (Signed)
 OUTPATIENT PHYSICAL THERAPY CERVICAL EVALUATION   Patient Name: Eric Nunez MRN: [id number] DOB:1971-11-09, 53 y.o., male Today's Date: 03/11/2024  END OF SESSION:  PT End of Session - 03/11/24 1030     Visit Number 1    Number of Visits 17    Date for PT Re-Evaluation 05/06/24    Authorization Type [employer] [facility]    PT Start Time 0845    PT Stop Time 0928    PT Time Calculation (min) 43 min    Activity Tolerance Patient tolerated treatment well    Behavior During Therapy [facility] for tasks assessed/performed             Past Medical History:  Diagnosis Date   Deafness    right ear   Elevated cholesterol 03/2020   Elevated TSH 03/2020   Right shoulder pain 03/2020   Past Surgical History:  Procedure Laterality Date   APPENDECTOMY     left knee repair     Patient Active Problem List   Diagnosis Date Noted   Right shoulder pain 04/16/2020   Thumb pain, right 04/16/2020   Encounter for follow-up 04/16/2020    PCP: Claiborne Rigg, NP  REFERRING PROVIDER: Cristie Hem, PA-C  REFERRING DIAG:  M54.2 (ICD-10-CM) - Neck pain M25.511,G89.29 (ICD-10-CM) - Chronic right shoulder pain  THERAPY DIAG:  Cervicalgia  Muscle weakness (generalized)  Abnormal posture  Rationale for Evaluation and Treatment: Rehabilitation  ONSET DATE: Chronic  SUBJECTIVE:                                                                                                                                                                                                         SUBJECTIVE STATEMENT: Pt presents to PT with reports of chronic R sided neck pain that refers into R UE. Notes N/T in R forearm and R hand, denies bowel/bladder changes or saddle anesthesia. Pt was recently laid off from his job, before this he had difficulty with lifting 25# and repetitive motions that exacerbated pain. He has had therapy in the past but states that they mainly focused on his shoulder and his  neck has felt like the primary issue.   Hand dominance: Right  PERTINENT HISTORY:  None  PAIN:  Are you having pain?  Yes: NPRS scale: 2/10 Pain location: R upper trap, R cervical paraspinals, R forearm Pain description: tight, achy, N/T Aggravating factors: lifting, turning head, prolonged sitting Relieving factors: none  PRECAUTIONS: None  RED FLAGS: None     WEIGHT BEARING RESTRICTIONS: No  FALLS:  Has patient  fallen in last 6 months? Yes. Number of falls - 2 while a   LIVING ENVIRONMENT: Lives with: lives alone Lives in: House/apartment  OCCUPATION: Not currently working  PLOF: Independent  PATIENT GOALS: decrease neck pain, be able to lift 25lbs objects without discomfort  OBJECTIVE:  Note: Objective measures were completed at Evaluation unless otherwise noted.  DIAGNOSTIC FINDINGS:  See imaging   PATIENT SURVEYS:  NDI: 22/50  COGNITION: Overall cognitive status: Within functional limits for tasks assessed  SENSATION: Light touch: Impaired - R dorsum of hand  POSTURE: rounded shoulders and forward head  PALPATION: TTP to R upper trap, R levator, R wrist extensors   CERVICAL ROM:   Active ROM A/PROM (deg) eval  Flexion   Extension   Right lateral flexion   Left lateral flexion   Right rotation 40  Left rotation 60   (Blank rows = not tested)  UPPER EXTREMITY ROM:  Active ROM Right eval Left eval  Shoulder flexion [facility]  Shoulder extension    Shoulder abduction [facility]  Shoulder adduction    Shoulder extension    Shoulder internal rotation    Shoulder external rotation    Elbow flexion    Elbow extension    Wrist flexion    Wrist extension    Wrist ulnar deviation    Wrist radial deviation    Wrist pronation    Wrist supination     (Blank rows = not tested)  UPPER EXTREMITY MMT:  MMT Right eval Left eval  Shoulder flexion [facility]  Shoulder extension    Shoulder abduction [facility]  Shoulder adduction    Shoulder  extension    Shoulder internal rotation    Shoulder external rotation    Middle trapezius 3+/5 3+/5  Lower trapezius 3+/5 3+/5  Elbow flexion    Elbow extension    Wrist flexion    Wrist extension    Wrist ulnar deviation    Wrist radial deviation    Wrist pronation    Wrist supination    Grip strength 90lbs 105lbs   (Blank rows = not tested)  CERVICAL SPECIAL TESTS:  Upper limb tension test (ULTT): Negative  FUNCTIONAL TESTS:  Deep Cervical Flexor Endurance: 12 sec  TREATMENT: [facility] Adult PT Treatment:                                                DATE: 03/11/2024 Therapeutic Exercise: Seated R wrist flex stretch x 30"  Seated R upper trap stretch x 30" Seated scapular retraction x 10 Standing row x 5 black band Corner pec stretch x 30" Supine chin tuck x 5  PATIENT EDUCATION:  Education details: eval findings, NDI, HEP, POC Person educated: Patient Education method: Explanation, Demonstration, and Handouts Education comprehension: verbalized understanding and returned demonstration  HOME EXERCISE PROGRAM: Access Code: [email or web address] URL: [email or web address] Date: 03/11/2024 Prepared by: Edwinna Areola  Exercises - Seated Wrist Flexion Stretch  - 1 x daily - 7 x weekly - 3 reps - 30 sec hold - Seated Upper Trapezius Stretch  - 1 x daily - 7 x weekly - 3 reps - 30 sec hold - Seated Scapular Retraction  - 1 x daily - 7 x weekly - 2 sets - 10 reps - 3 sec hold - Standing Shoulder Row with Anchored Resistance  - 1 x daily - 7  x weekly - 3 sets - 10 reps - black band hold - Supine Chin Tuck  - 1 x daily - 7 x weekly - 2 sets - 10 reps - 5 sec hold  ASSESSMENT:  CLINICAL IMPRESSION: Patient is a 53 y.o. M who was seen today for physical therapy evaluation and treatment for chronic neck and UE pain. Physical findings are consistent with referring provider impression as pt demonstrates decrease in cervical ROM and UE function. NDI score severe disability in  performance of home ADLs and community activities. Pt would benefit from skilled PT services working on improving cervical ROM, DNF endurance, and postural muscle strength in order to decrease pain and improve function.   OBJECTIVE IMPAIRMENTS: decreased activity tolerance, decreased mobility, difficulty walking, decreased ROM, decreased strength, impaired UE functional use, postural dysfunction, and pain.   ACTIVITY LIMITATIONS: carrying, lifting, sitting, and reach over head  PARTICIPATION LIMITATIONS: driving, shopping, community activity, occupation, and yard work  PERSONAL FACTORS: Time since onset of injury/illness/exacerbation are also affecting patient's functional outcome.   REHAB POTENTIAL: Excellent  CLINICAL DECISION MAKING: Stable/uncomplicated  EVALUATION COMPLEXITY: Low   GOALS: Goals reviewed with patient? No  SHORT TERM GOALS: Target date: 04/01/2024   Pt will be compliant and knowledgeable with initial HEP for improved comfort and carryover Baseline: initial HEP given  Goal status: INITIAL  2.  Pt will self report neck and R UE pain no greater than 6/10 for improved comfort and functional ability Baseline: 10/10 at worst Goal status: INITIAL   LONG TERM GOALS: Target date: 05/06/2024   Pt will decrease NDI disability score to no greater than 11/50 as proxy for functional improvement Baseline: 22/50 Goal status: INITIAL   2.  Pt will self report neck and R UE pain no greater than 3/10 for improved comfort and functional ability Baseline: 10/10 at worst Goal status: INITIAL   3.  Pt will improve R cervical rotation AROM to no less than 60 degrees for improved functional ability and comfort with home ADLs and driving  Baseline: 40 seconds Goal status: INITIAL  4.  Pt will improve bilateral middle/lower trap strength to at least 4/5 for improved postural strength and decreased neck pain Baseline: 3+/5 Goal status: INITIAL  5.  Pt will improve R grip  strength to at least 105lbs for improved functional ability with grasping tasks Baseline: 90lbs Goal status: INITIAL   PLAN:  PT FREQUENCY: 2x/week  PT DURATION: 8 weeks  PLANNED INTERVENTIONS: 97164- PT Re-evaluation, 97110-Therapeutic exercises, 97530- Therapeutic activity, 97112- Neuromuscular re-education, 97535- Self Care, 40981- Manual therapy, G0283- Electrical stimulation (unattended), [phone]- Electrical stimulation (manual), Dry Needling, Cryotherapy, and Moist heat  PLAN FOR NEXT SESSION: assess HEP response, DNF and periscapular strengthening, manual for neck and R UE   Eloy End, PT 03/11/2024, 10:34 AM  [employer] Authorization   Choose one: Rehabilitative  Standardized Assessment or Functional Outcome Tool: NDI  Score or Percent Disability: 22/50  Body Parts Treated (Select each separately):  Cervicothoracic. Overall deficits/functional limitations for body part selected: moderate Shoulder. Overall deficits/functional limitations for body part selected: mild N/A. Overall deficits/functional limitations for body part selected: N/A   If treatment provided at initial evaluation, no treatment charged due to lack of authorization.

## 2024-03-14 ENCOUNTER — Ambulatory Visit: Payer: Self-pay

## 2024-03-14 DIAGNOSIS — M542 Cervicalgia: Secondary | ICD-10-CM

## 2024-03-14 DIAGNOSIS — M6281 Muscle weakness (generalized): Secondary | ICD-10-CM

## 2024-03-14 DIAGNOSIS — R293 Abnormal posture: Secondary | ICD-10-CM

## 2024-03-14 NOTE — Therapy (Signed)
 OUTPATIENT PHYSICAL THERAPY TREATMENT   Patient Name: Eric Nunez MRN: [id number] DOB:September 12, 1971, 53 y.o., male Today's Date: 03/14/2024  END OF SESSION:  PT End of Session - 03/14/24 0838     Visit Number 2    Number of Visits 17    Date for PT Re-Evaluation 05/06/24    Authorization Type [facility]    PT Start Time 0845    PT Stop Time 0925    PT Time Calculation (min) 40 min    Activity Tolerance Patient tolerated treatment well    Behavior During Therapy [facility] for tasks assessed/performed              Past Medical History:  Diagnosis Date   Deafness    right ear   Elevated cholesterol 03/2020   Elevated TSH 03/2020   Right shoulder pain 03/2020   Past Surgical History:  Procedure Laterality Date   APPENDECTOMY     left knee repair     Patient Active Problem List   Diagnosis Date Noted   Right shoulder pain 04/16/2020   Thumb pain, right 04/16/2020   Encounter for follow-up 04/16/2020    PCP: Claiborne Rigg, NP  REFERRING PROVIDER: Cristie Hem, PA-C  REFERRING DIAG:  M54.2 (ICD-10-CM) - Neck pain M25.511,G89.29 (ICD-10-CM) - Chronic right shoulder pain  THERAPY DIAG:  Cervicalgia  Muscle weakness (generalized)  Abnormal posture  Rationale for Evaluation and Treatment: Rehabilitation  ONSET DATE: Chronic  SUBJECTIVE:                                                                                                                                                                                                         SUBJECTIVE STATEMENT: Pt presents to PT with reports of continued pain, increased R hand pain noted day after PT eval. Unsure of aggravating factors.   EVAL: Pt presents to PT with reports of chronic R sided neck pain that refers into R UE. Notes N/T in R forearm and R hand, denies bowel/bladder changes or saddle anesthesia. Pt was recently laid off from his job, before this he had difficulty with lifting 25# and  repetitive motions that exacerbated pain. He has had therapy in the past but states that they mainly focused on his shoulder and his neck has felt like the primary issue.   Hand dominance: Right  PERTINENT HISTORY:  None  PAIN:  Are you having pain?  Yes: NPRS scale: 2/10 Pain location: R upper trap, R cervical paraspinals, R forearm Pain description: tight, achy, N/T Aggravating factors: lifting, turning head,  prolonged sitting Relieving factors: none  PRECAUTIONS: None  RED FLAGS: None     WEIGHT BEARING RESTRICTIONS: No  FALLS:  Has patient fallen in last 6 months? Yes. Number of falls - 2 while a   LIVING ENVIRONMENT: Lives with: lives alone Lives in: House/apartment  OCCUPATION: Not currently working  PLOF: Independent  PATIENT GOALS: decrease neck pain, be able to lift 25lbs objects without discomfort  OBJECTIVE:  Note: Objective measures were completed at Evaluation unless otherwise noted.  DIAGNOSTIC FINDINGS:  See imaging   PATIENT SURVEYS:  NDI: 22/50  COGNITION: Overall cognitive status: Within functional limits for tasks assessed  SENSATION: Light touch: Impaired - R dorsum of hand  POSTURE: rounded shoulders and forward head  PALPATION: TTP to R upper trap, R levator, R wrist extensors   CERVICAL ROM:   Active ROM A/PROM (deg) eval  Flexion   Extension   Right lateral flexion   Left lateral flexion   Right rotation 40  Left rotation 60   (Blank rows = not tested)  UPPER EXTREMITY ROM:  Active ROM Right eval Left eval  Shoulder flexion [facility]  Shoulder extension    Shoulder abduction [facility]  Shoulder adduction    Shoulder extension    Shoulder internal rotation    Shoulder external rotation    Elbow flexion    Elbow extension    Wrist flexion    Wrist extension    Wrist ulnar deviation    Wrist radial deviation    Wrist pronation    Wrist supination     (Blank rows = not tested)  UPPER EXTREMITY MMT:  MMT  Right eval Left eval  Shoulder flexion [facility]  Shoulder extension    Shoulder abduction [facility]  Shoulder adduction    Shoulder extension    Shoulder internal rotation    Shoulder external rotation    Middle trapezius 3+/5 3+/5  Lower trapezius 3+/5 3+/5  Elbow flexion    Elbow extension    Wrist flexion    Wrist extension    Wrist ulnar deviation    Wrist radial deviation    Wrist pronation    Wrist supination    Grip strength 90lbs 105lbs   (Blank rows = not tested)  CERVICAL SPECIAL TESTS:  Upper limb tension test (ULTT): Negative  FUNCTIONAL TESTS:  Deep Cervical Flexor Endurance: 12 sec  TREATMENT: [facility] Adult PT Treatment:                                                DATE: 03/14/2024 Therapeutic Exercise: R median nerve glide x 10 Seated scapular retractions 2x10 Seated chin tuck 2x10 Standing chin tuck with ball x 10 Manual Therapy: Supine cervical side glides grade II Suboccipital release  Positional release R upper trap Manual cervical traction STM to R wrist extensors  [facility] Adult PT Treatment:                                                DATE: 03/11/2024 Therapeutic Exercise: Seated R wrist flex stretch x 30"  Seated R upper trap stretch x 30" Seated scapular retraction x 10 Standing row x 5 black band Corner pec stretch x 30" Supine chin  tuck x 5  PATIENT EDUCATION:  Education details: eval findings, NDI, HEP, POC Person educated: Patient Education method: Explanation, Demonstration, and Handouts Education comprehension: verbalized understanding and returned demonstration  HOME EXERCISE PROGRAM: Access Code: [email or web address] URL: [email or web address] Date: 03/11/2024 Prepared by: Edwinna Areola  Exercises - Seated Wrist Flexion Stretch  - 1 x daily - 7 x weekly - 3 reps - 30 sec hold - Seated Upper Trapezius Stretch  - 1 x daily - 7 x weekly - 3 reps - 30 sec hold - Seated Scapular Retraction  - 1 x daily - 7 x weekly - 2 sets - 10  reps - 3 sec hold - Standing Shoulder Row with Anchored Resistance  - 1 x daily - 7 x weekly - 3 sets - 10 reps - black band hold - Supine Chin Tuck  - 1 x daily - 7 x weekly - 2 sets - 10 reps - 5 sec hold  ASSESSMENT:  CLINICAL IMPRESSION: Pt tolerated treatment fair but was limited by severe pain. Exercises today focused on postural stability and DNF endurance training. PT performed manual therapy aimed at decreasing soft tissue irritation and nerve related pain. HEP updated with therapy putty for improving R grip strength. Pt continue to benefit from skilled PT services, will continue to progress per POC.   EVAL: Patient is a 53 y.o. M who was seen today for physical therapy evaluation and treatment for chronic neck and UE pain. Physical findings are consistent with referring provider impression as pt demonstrates decrease in cervical ROM and UE function. NDI score severe disability in performance of home ADLs and community activities. Pt would benefit from skilled PT services working on improving cervical ROM, DNF endurance, and postural muscle strength in order to decrease pain and improve function.   OBJECTIVE IMPAIRMENTS: decreased activity tolerance, decreased mobility, difficulty walking, decreased ROM, decreased strength, impaired UE functional use, postural dysfunction, and pain.   ACTIVITY LIMITATIONS: carrying, lifting, sitting, and reach over head  PARTICIPATION LIMITATIONS: driving, shopping, community activity, occupation, and yard work  PERSONAL FACTORS: Time since onset of injury/illness/exacerbation are also affecting patient's functional outcome.   REHAB POTENTIAL: Excellent  CLINICAL DECISION MAKING: Stable/uncomplicated  EVALUATION COMPLEXITY: Low   GOALS: Goals reviewed with patient? No  SHORT TERM GOALS: Target date: 04/01/2024   Pt will be compliant and knowledgeable with initial HEP for improved comfort and carryover Baseline: initial HEP given  Goal status:  INITIAL  2.  Pt will self report neck and R UE pain no greater than 6/10 for improved comfort and functional ability Baseline: 10/10 at worst Goal status: INITIAL   LONG TERM GOALS: Target date: 05/06/2024   Pt will decrease NDI disability score to no greater than 11/50 as proxy for functional improvement Baseline: 22/50 Goal status: INITIAL   2.  Pt will self report neck and R UE pain no greater than 3/10 for improved comfort and functional ability Baseline: 10/10 at worst Goal status: INITIAL   3.  Pt will improve R cervical rotation AROM to no less than 60 degrees for improved functional ability and comfort with home ADLs and driving  Baseline: 40 seconds Goal status: INITIAL  4.  Pt will improve bilateral middle/lower trap strength to at least 4/5 for improved postural strength and decreased neck pain Baseline: 3+/5 Goal status: INITIAL  5.  Pt will improve R grip strength to at least 105lbs for improved functional ability with grasping tasks Baseline: 90lbs Goal status: INITIAL   PLAN:  PT FREQUENCY: 2x/week  PT DURATION: 8 weeks  PLANNED INTERVENTIONS: 97164- PT Re-evaluation, 97110-Therapeutic exercises, 97530- Therapeutic activity, 97112- Neuromuscular re-education, 97535- Self Care, 40981- Manual therapy, G0283- Electrical stimulation (unattended), [phone]- Electrical stimulation (manual), Dry Needling, Cryotherapy, and Moist heat  PLAN FOR NEXT SESSION: assess HEP response, DNF and periscapular strengthening, manual for neck and R UE   Eloy End, PT 03/14/2024, 9:27 AM  [employer] Authorization   Choose one: Rehabilitative  Standardized Assessment or Functional Outcome Tool: NDI  Score or Percent Disability: 22/50  Body Parts Treated (Select each separately):  Cervicothoracic. Overall deficits/functional limitations for body part selected: moderate Shoulder. Overall deficits/functional limitations for body part selected: mild N/A. Overall  deficits/functional limitations for body part selected: N/A   If treatment provided at initial evaluation, no treatment charged due to lack of authorization.

## 2024-03-23 ENCOUNTER — Ambulatory Visit: Attending: Physician Assistant | Admitting: Physical Therapy

## 2024-03-23 DIAGNOSIS — R293 Abnormal posture: Secondary | ICD-10-CM | POA: Insufficient documentation

## 2024-03-23 DIAGNOSIS — M6281 Muscle weakness (generalized): Secondary | ICD-10-CM | POA: Insufficient documentation

## 2024-03-23 DIAGNOSIS — M542 Cervicalgia: Secondary | ICD-10-CM | POA: Diagnosis present

## 2024-03-23 NOTE — Therapy (Signed)
 OUTPATIENT PHYSICAL THERAPY TREATMENT   Patient Name: Eric Nunez MRN: [id number] DOB:11-03-1971, 53 y.o., male Today's Date: 03/23/2024  END OF SESSION:  PT End of Session - 03/23/24 1424     Visit Number 3    Number of Visits 17    Date for PT Re-Evaluation 05/06/24    Authorization Type [facility]    Authorization Time Period 3/24 -  05/13/24    Authorization - Visit Number 2    Authorization - Number of Visits 10               Past Medical History:  Diagnosis Date   Deafness    right ear   Elevated cholesterol 03/2020   Elevated TSH 03/2020   Right shoulder pain 03/2020   Past Surgical History:  Procedure Laterality Date   APPENDECTOMY     left knee repair     Patient Active Problem List   Diagnosis Date Noted   Right shoulder pain 04/16/2020   Thumb pain, right 04/16/2020   Encounter for follow-up 04/16/2020    PCP: Claiborne Rigg, NP  REFERRING PROVIDER: Cristie Hem, PA-C  REFERRING DIAG:  M54.2 (ICD-10-CM) - Neck pain M25.511,G89.29 (ICD-10-CM) - Chronic right shoulder pain  THERAPY DIAG:  Cervicalgia  Muscle weakness (generalized)  Abnormal posture  Rationale for Evaluation and Treatment: Rehabilitation  ONSET DATE: Chronic  SUBJECTIVE:                                                                                                                                                                                                         SUBJECTIVE STATEMENT: 03/23/2024 "The exercises are making it worse, and its not getting any better."  EVAL: Pt presents to PT with reports of chronic R sided neck pain that refers into R UE. Notes N/T in R forearm and R hand, denies bowel/bladder changes or saddle anesthesia. Pt was recently laid off from his job, before this he had difficulty with lifting 25# and repetitive motions that exacerbated pain. He has had therapy in the past but states that they mainly focused on his shoulder and  his neck has felt like the primary issue.   Hand dominance: Right  PERTINENT HISTORY:  None  PAIN:  Are you having pain?  Yes: NPRS scale: 2/10 Pain location: R upper trap, R cervical paraspinals, R forearm Pain description: tight, achy, N/T Aggravating factors: lifting, turning head, prolonged sitting Relieving factors: none  PRECAUTIONS: None  RED FLAGS: None     WEIGHT BEARING RESTRICTIONS: No  FALLS:  Has  patient fallen in last 6 months? Yes. Number of falls - 2 while a   LIVING ENVIRONMENT: Lives with: lives alone Lives in: House/apartment  OCCUPATION: Not currently working  PLOF: Independent  PATIENT GOALS: decrease neck pain, be able to lift 25lbs objects without discomfort  OBJECTIVE:  Note: Objective measures were completed at Evaluation unless otherwise noted.  DIAGNOSTIC FINDINGS:  See imaging   PATIENT SURVEYS:  NDI: 22/50  COGNITION: Overall cognitive status: Within functional limits for tasks assessed  SENSATION: Light touch: Impaired - R dorsum of hand  POSTURE: rounded shoulders and forward head  PALPATION: TTP to R upper trap, R levator, R wrist extensors   CERVICAL ROM:   Active ROM A/PROM (deg) eval  Flexion   Extension   Right lateral flexion   Left lateral flexion   Right rotation 40  Left rotation 60   (Blank rows = not tested)  UPPER EXTREMITY ROM:  Active ROM Right eval Left eval  Shoulder flexion [facility]  Shoulder extension    Shoulder abduction [facility]  Shoulder adduction    Shoulder extension    Shoulder internal rotation    Shoulder external rotation    Elbow flexion    Elbow extension    Wrist flexion    Wrist extension    Wrist ulnar deviation    Wrist radial deviation    Wrist pronation    Wrist supination     (Blank rows = not tested)  UPPER EXTREMITY MMT:  MMT Right eval Left eval  Shoulder flexion [facility]  Shoulder extension    Shoulder abduction [facility]  Shoulder adduction    Shoulder  extension    Shoulder internal rotation    Shoulder external rotation    Middle trapezius 3+/5 3+/5  Lower trapezius 3+/5 3+/5  Elbow flexion    Elbow extension    Wrist flexion    Wrist extension    Wrist ulnar deviation    Wrist radial deviation    Wrist pronation    Wrist supination    Grip strength 90lbs 105lbs   (Blank rows = not tested)  CERVICAL SPECIAL TESTS:  Upper limb tension test (ULTT): Negative  FUNCTIONAL TESTS:  Deep Cervical Flexor Endurance: 12 sec  TREATMENT: [facility] Adult PT Treatment:                                                DATE: 03/23/2024 Sub-occpitial release MTPR along the R upper trap, reviewed how to perform at home.  Traction using saunders unit x 5 min 15# of pressure.  Cervical Snags to promote R vertebral gapping Seated chin tuck  Reviewed using pain as guide and backing off to prevent increased tension Reviewed seated posture to promote efficient posture    [facility] Adult PT Treatment:                                                DATE: 03/14/2024 Therapeutic Exercise: R median nerve glide x 10 Seated scapular retractions 2x10 Seated chin tuck 2x10 Standing chin tuck with ball x 10 Manual Therapy: Supine cervical side glides grade II Suboccipital release  Positional release R upper trap Manual cervical traction STM to R wrist extensors  [facility] Adult PT Treatment:                                                DATE: 03/11/2024 Therapeutic Exercise: Seated R wrist flex stretch x 30"  Seated R upper trap stretch x 30" Seated scapular retraction x 10 Standing row x 5 black band Corner pec stretch x 30" Supine chin tuck x 5  PATIENT EDUCATION:  Education details: eval findings, NDI, HEP, POC Person educated: Patient Education method: Explanation, Demonstration, and Handouts Education comprehension: verbalized understanding and returned demonstration  HOME EXERCISE PROGRAM: Access Code: [email or web address] URL:  [email or web address] Date: 03/23/2024 Prepared by: Lulu Riding  Exercises - Seated Wrist Flexion Stretch  - 1 x daily - 7 x weekly - 3 reps - 30 sec hold - Seated Upper Trapezius Stretch  - 1 x daily - 7 x weekly - 3 reps - 30 sec hold - Seated Scapular Retraction  - 1 x daily - 7 x weekly - 2 sets - 10 reps - 3 sec hold - Standing Shoulder Row with Anchored Resistance  - 1 x daily - 7 x weekly - 3 sets - 10 reps - black band hold - Supine Chin Tuck  - 1 x daily - 7 x weekly - 2 sets - 10 reps - 5 sec hold - Cervical SNAG Rotation (Mirrored)  - 1 x daily - 7 x weekly - 2 sets - 10 reps - 3- 5 sec hold - Seated or Standing Cervical Retraction  - 1 x daily - 7 x weekly - 2 sets - 10 reps - 5- 10 sec hold  ASSESSMENT:  CLINICAL IMPRESSION: 03/23/2024 Mr Ridlon arrives to session noting no reduction in pain and reports the exercises aren't helping. Reviewed exercises and noted to use pain as guide and when if he is pushing beyond the pain to back of slightly. Updated HEP today for Cervical snags and MTPR and reviewed self manual trigger point release. Trialed traction which he did not some relief during but reported feeling the pain return once it was completed. End of session he noted feeling no changes but reported he plans to focus on not over doing it with he exercises.   EVAL: Patient is a 52 y.o. M who was seen today for physical therapy evaluation and treatment for chronic neck and UE pain. Physical findings are consistent with referring provider impression as pt demonstrates decrease in cervical ROM and UE function. NDI score severe disability in performance of home ADLs and community activities. Pt would benefit from skilled PT services working on improving cervical ROM, DNF endurance, and postural muscle strength in order to decrease pain and improve function.   OBJECTIVE IMPAIRMENTS: decreased activity tolerance, decreased mobility, difficulty walking, decreased ROM,  decreased strength, impaired UE functional use, postural dysfunction, and pain.   ACTIVITY LIMITATIONS: carrying, lifting, sitting, and reach over head  PARTICIPATION LIMITATIONS: driving, shopping, community activity, occupation, and yard work  PERSONAL FACTORS: Time since onset of injury/illness/exacerbation are also affecting patient's functional outcome.   REHAB POTENTIAL: Excellent  CLINICAL DECISION MAKING: Stable/uncomplicated  EVALUATION COMPLEXITY: Low   GOALS: Goals reviewed with patient? No  SHORT TERM GOALS: Target date: 04/01/2024   Pt will be compliant and knowledgeable with initial HEP for improved comfort and carryover Baseline: initial HEP given  Goal status: INITIAL  2.  Pt will  self report neck and R UE pain no greater than 6/10 for improved comfort and functional ability Baseline: 10/10 at worst Goal status: INITIAL   LONG TERM GOALS: Target date: 05/06/2024   Pt will decrease NDI disability score to no greater than 11/50 as proxy for functional improvement Baseline: 22/50 Goal status: INITIAL   2.  Pt will self report neck and R UE pain no greater than 3/10 for improved comfort and functional ability Baseline: 10/10 at worst Goal status: INITIAL   3.  Pt will improve R cervical rotation AROM to no less than 60 degrees for improved functional ability and comfort with home ADLs and driving  Baseline: 40 seconds Goal status: INITIAL  4.  Pt will improve bilateral middle/lower trap strength to at least 4/5 for improved postural strength and decreased neck pain Baseline: 3+/5 Goal status: INITIAL  5.  Pt will improve R grip strength to at least 105lbs for improved functional ability with grasping tasks Baseline: 90lbs Goal status: INITIAL   PLAN:  PT FREQUENCY: 2x/week  PT DURATION: 8 weeks  PLANNED INTERVENTIONS: 97164- PT Re-evaluation, 97110-Therapeutic exercises, 97530- Therapeutic activity, [id number]- Neuromuscular re-education, 97535- Self  Care, 16109- Manual therapy, G0283- Electrical stimulation (unattended), [id number]- Electrical stimulation (manual), Dry Needling, Cryotherapy, and Moist heat  PLAN FOR NEXT SESSION: assess HEP response, DNF and periscapular strengthening, manual for neck and R UE. Response to traction.    Cloys Vera PT, DPT, LAT, ATC  03/23/24  3:20 PM

## 2024-03-30 ENCOUNTER — Ambulatory Visit: Admitting: Physical Therapy

## 2024-03-30 ENCOUNTER — Encounter: Payer: Self-pay | Admitting: Physical Therapy

## 2024-03-30 DIAGNOSIS — M542 Cervicalgia: Secondary | ICD-10-CM | POA: Diagnosis not present

## 2024-03-30 DIAGNOSIS — M6281 Muscle weakness (generalized): Secondary | ICD-10-CM

## 2024-03-30 DIAGNOSIS — R293 Abnormal posture: Secondary | ICD-10-CM

## 2024-03-30 NOTE — Therapy (Signed)
 OUTPATIENT PHYSICAL THERAPY TREATMENT   Patient Name: Eric Nunez MRN: [id number] DOB:[birth year], 53 y.o., male Today's Date: 03/30/2024  END OF SESSION:  PT End of Session - 03/30/24 1416     Visit Number 4    Number of Visits 17    Date for PT Re-Evaluation 05/06/24    Authorization Time Period 3/24 -  05/13/24    Authorization - Visit Number 3    Authorization - Number of Visits 10    PT Start Time 1417    PT Stop Time 1520    PT Time Calculation (min) 63 min    Activity Tolerance Patient tolerated treatment well    Behavior During Therapy [facility] for tasks assessed/performed               Past Medical History:  Diagnosis Date   Deafness    right ear   Elevated cholesterol 03/2020   Elevated TSH 03/2020   Right shoulder pain 03/2020   Past Surgical History:  Procedure Laterality Date   APPENDECTOMY     left knee repair     Patient Active Problem List   Diagnosis Date Noted   Right shoulder pain 04/16/2020   Thumb pain, right 04/16/2020   Encounter for follow-up 04/16/2020    PCP: Claiborne Rigg, NP  REFERRING PROVIDER: Cristie Hem, PA-C  REFERRING DIAG:  M54.2 (ICD-10-CM) - Neck pain M25.511,G89.29 (ICD-10-CM) - Chronic right shoulder pain  THERAPY DIAG:  Cervicalgia  Muscle weakness (generalized)  Abnormal posture  Rationale for Evaluation and Treatment: Rehabilitation  ONSET DATE: Chronic  SUBJECTIVE:                                                                                                                                                                                                         SUBJECTIVE STATEMENT: 03/30/2024 "Was feeling pretty sore after the last session the next day. He reported having more tension in the R shoulder and it hurt to move. He did see the MD and they are getting an MRI set up for next week."  EVAL: Pt presents to PT with reports of chronic R sided neck pain that refers into R UE. Notes N/T  in R forearm and R hand, denies bowel/bladder changes or saddle anesthesia. Pt was recently laid off from his job, before this he had difficulty with lifting 25# and repetitive motions that exacerbated pain. He has had therapy in the past but states that they mainly focused on his shoulder and his neck has felt like the primary issue.  Hand dominance: Right  PERTINENT HISTORY:  None  PAIN:  Are you having pain?  Yes: NPRS scale: 2/10 Pain location: R upper trap, R cervical paraspinals, R forearm Pain description: tight, achy, N/T Aggravating factors: lifting, turning head, prolonged sitting Relieving factors: none  PRECAUTIONS: None  RED FLAGS: None     WEIGHT BEARING RESTRICTIONS: No  FALLS:  Has patient fallen in last 6 months? Yes. Number of falls - 2 while a   LIVING ENVIRONMENT: Lives with: lives alone Lives in: House/apartment  OCCUPATION: Not currently working  PLOF: Independent  PATIENT GOALS: decrease neck pain, be able to lift 25lbs objects without discomfort  OBJECTIVE:  Note: Objective measures were completed at Evaluation unless otherwise noted.  DIAGNOSTIC FINDINGS:  See imaging   PATIENT SURVEYS:  NDI: 22/50  COGNITION: Overall cognitive status: Within functional limits for tasks assessed  SENSATION: Light touch: Impaired - R dorsum of hand  POSTURE: rounded shoulders and forward head  PALPATION: TTP to R upper trap, R levator, R wrist extensors   CERVICAL ROM:   Active ROM A/PROM (deg) eval  Flexion   Extension   Right lateral flexion   Left lateral flexion   Right rotation 40  Left rotation 60   (Blank rows = not tested)  UPPER EXTREMITY ROM:  Active ROM Right eval Left eval  Shoulder flexion [facility]  Shoulder extension    Shoulder abduction [facility]  Shoulder adduction    Shoulder extension    Shoulder internal rotation    Shoulder external rotation    Elbow flexion    Elbow extension    Wrist flexion    Wrist  extension    Wrist ulnar deviation    Wrist radial deviation    Wrist pronation    Wrist supination     (Blank rows = not tested)  UPPER EXTREMITY MMT:  MMT Right eval Left eval  Shoulder flexion [facility]  Shoulder extension    Shoulder abduction [facility]  Shoulder adduction    Shoulder extension    Shoulder internal rotation    Shoulder external rotation    Middle trapezius 3+/5 3+/5  Lower trapezius 3+/5 3+/5  Elbow flexion    Elbow extension    Wrist flexion    Wrist extension    Wrist ulnar deviation    Wrist radial deviation    Wrist pronation    Wrist supination    Grip strength 90lbs 105lbs   (Blank rows = not tested)  CERVICAL SPECIAL TESTS:  Upper limb tension test (ULTT): Negative  FUNCTIONAL TESTS:  Deep Cervical Flexor Endurance: 12 sec  TREATMENT: [facility] Adult PT Treatment:                                                DATE: 03/30/2024 MTPR along the R upper trap/ levator scapulae First rib mobs with belt 1 x 5, then performing with movement pulling with exhalation and off with inhalations.  R upper trap stretch and levator scapulae stretch 2 x 30 ea.  SNAGS, to promote R cervical gapping. With strap Strap cervical extension with movement R cervical lateral gapping with strap Pain education hurt vs harm, and continue to stay within pain free range Posture / sleeping position Updated HEP today.   [facility] Adult PT Treatment:  DATE: 03/23/2024 Sub-occpitial release MTPR along the R upper trap, reviewed how to perform at home.  Traction using saunders unit x 5 min 15# of pressure.  Cervical Snags to promote R vertebral gapping Seated chin tuck  Reviewed using pain as guide and backing off to prevent increased tension Reviewed seated posture to promote efficient posture    [facility] Adult PT Treatment:                                                DATE: 03/14/2024 Therapeutic Exercise: R median nerve glide x  10 Seated scapular retractions 2x10 Seated chin tuck 2x10 Standing chin tuck with ball x 10 Manual Therapy: Supine cervical side glides grade II Suboccipital release  Positional release R upper trap Manual cervical traction STM to R wrist extensors  [facility] Adult PT Treatment:                                                DATE: 03/11/2024 Therapeutic Exercise: Seated R wrist flex stretch x 30"  Seated R upper trap stretch x 30" Seated scapular retraction x 10 Standing row x 5 black band Corner pec stretch x 30" Supine chin tuck x 5  PATIENT EDUCATION:  Education details: eval findings, NDI, HEP, POC Person educated: Patient Education method: Explanation, Demonstration, and Handouts Education comprehension: verbalized understanding and returned demonstration  HOME EXERCISE PROGRAM: Access Code: [email or web address] URL: [email or web address] Date: 03/30/2024 Prepared by: Lulu Riding  Exercises - Seated Wrist Flexion Stretch  - 1 x daily - 7 x weekly - 3 reps - 30 sec hold - Seated Upper Trapezius Stretch  - 1 x daily - 7 x weekly - 3 reps - 30 sec hold - Seated Scapular Retraction  - 1 x daily - 7 x weekly - 2 sets - 10 reps - 3 sec hold - Standing Shoulder Row with Anchored Resistance  - 1 x daily - 7 x weekly - 3 sets - 10 reps - black band hold - Supine Chin Tuck  - 1 x daily - 7 x weekly - 2 sets - 10 reps - 5 sec hold - Cervical SNAG Rotation (Mirrored)  - 1 x daily - 7 x weekly - 2 sets - 10 reps - 3- 5 sec hold - Seated or Standing Cervical Retraction  - 1 x daily - 7 x weekly - 2 sets - 10 reps - 5- 10 sec hold - Gentle Levator Scapulae Stretch (Mirrored)  - 1 x daily - 7 x weekly - 2 sets - 2 reps - 30 seconds hold - 3 Finger Cervical Rotation  - 1 x daily - 7 x weekly - 2 sets - 10 reps  ASSESSMENT:  CLINICAL IMPRESSION: 03/30/2024 Mr Fermin reports to PT today noting no changes in pain today and described increased pain following the last session the  following day. Focused session on mobility to maximize movement within his pain free mobility. Time was taken to review pain education / PNE. Updated HEP to levator stretch and upper cervical mobility. He plans to get an MRI on 04/07/23, and come to PT after that which we will determine at that time if more PT would be beneficial vs holding off until he  follows up with his MD.   EVAL: Patient is a 53 y.o. M who was seen today for physical therapy evaluation and treatment for chronic neck and UE pain. Physical findings are consistent with referring provider impression as pt demonstrates decrease in cervical ROM and UE function. NDI score severe disability in performance of home ADLs and community activities. Pt would benefit from skilled PT services working on improving cervical ROM, DNF endurance, and postural muscle strength in order to decrease pain and improve function.   OBJECTIVE IMPAIRMENTS: decreased activity tolerance, decreased mobility, difficulty walking, decreased ROM, decreased strength, impaired UE functional use, postural dysfunction, and pain.   ACTIVITY LIMITATIONS: carrying, lifting, sitting, and reach over head  PARTICIPATION LIMITATIONS: driving, shopping, community activity, occupation, and yard work  PERSONAL FACTORS: Time since onset of injury/illness/exacerbation are also affecting patient's functional outcome.   REHAB POTENTIAL: Excellent  CLINICAL DECISION MAKING: Stable/uncomplicated  EVALUATION COMPLEXITY: Low   GOALS: Goals reviewed with patient? No  SHORT TERM GOALS: Target date: 04/01/2024   Pt will be compliant and knowledgeable with initial HEP for improved comfort and carryover Baseline: initial HEP given  Goal status: MET  2.  Pt will self report neck and R UE pain no greater than 6/10 for improved comfort and functional ability Baseline: 10/10 at worst Goal status: Ongoing  LONG TERM GOALS: Target date: 05/06/2024   Pt will decrease NDI disability  score to no greater than 11/50 as proxy for functional improvement Baseline: 22/50 Goal status: INITIAL   2.  Pt will self report neck and R UE pain no greater than 3/10 for improved comfort and functional ability Baseline: 10/10 at worst Goal status: INITIAL   3.  Pt will improve R cervical rotation AROM to no less than 60 degrees for improved functional ability and comfort with home ADLs and driving  Baseline: 40 seconds Goal status: INITIAL  4.  Pt will improve bilateral middle/lower trap strength to at least 4/5 for improved postural strength and decreased neck pain Baseline: 3+/5 Goal status: INITIAL  5.  Pt will improve R grip strength to at least 105lbs for improved functional ability with grasping tasks Baseline: 90lbs Goal status: INITIAL   PLAN:  PT FREQUENCY: 2x/week  PT DURATION: 8 weeks  PLANNED INTERVENTIONS: 97164- PT Re-evaluation, 97110-Therapeutic exercises, 97530- Therapeutic activity, [id number]- Neuromuscular re-education, 97535- Self Care, 65784- Manual therapy, G0283- Electrical stimulation (unattended), [id number]- Electrical stimulation (manual), Dry Needling, Cryotherapy, and Moist heat  PLAN FOR NEXT SESSION: assess HEP response, DNF and periscapular strengthening, manual for neck and R UE. Response to traction.    Bellany Elbaum PT, DPT, LAT, ATC  03/30/24  3:36 PM

## 2024-04-06 ENCOUNTER — Ambulatory Visit: Admitting: Physical Therapy

## 2024-04-06 ENCOUNTER — Telehealth: Payer: Self-pay | Admitting: Physical Therapy

## 2024-04-06 DIAGNOSIS — M542 Cervicalgia: Secondary | ICD-10-CM | POA: Diagnosis not present

## 2024-04-06 DIAGNOSIS — R293 Abnormal posture: Secondary | ICD-10-CM

## 2024-04-06 DIAGNOSIS — M6281 Muscle weakness (generalized): Secondary | ICD-10-CM

## 2024-04-06 NOTE — Patient Instructions (Signed)

## 2024-04-06 NOTE — Therapy (Signed)
 OUTPATIENT PHYSICAL THERAPY TREATMENT   Patient Name: Eric Nunez MRN: [id number] DOB:April 10, 1971, 53 y.o., male Today's Date: 04/06/2024  END OF SESSION:  PT End of Session - 04/06/24 1415     Visit Number 5    Number of Visits 17    Authorization Type MCD [facility]    Authorization - Visit Number 4    Authorization - Number of Visits 10    PT Start Time 1415    Activity Tolerance Patient tolerated treatment well    Behavior During Therapy [facility] for tasks assessed/performed               Past Medical History:  Diagnosis Date   Deafness    right ear   Elevated cholesterol 03/2020   Elevated TSH 03/2020   Right shoulder pain 03/2020   Past Surgical History:  Procedure Laterality Date   APPENDECTOMY     left knee repair     Patient Active Problem List   Diagnosis Date Noted   Right shoulder pain 04/16/2020   Thumb pain, right 04/16/2020   Encounter for follow-up 04/16/2020    PCP: Collins Dean, NP  REFERRING PROVIDER: Sandie Cross, PA-C  REFERRING DIAG:  M54.2 (ICD-10-CM) - Neck pain M25.511,G89.29 (ICD-10-CM) - Chronic right shoulder pain  THERAPY DIAG:  Cervicalgia  Muscle weakness (generalized)  Abnormal posture  Rationale for Evaluation and Treatment: Rehabilitation  ONSET DATE: Chronic  SUBJECTIVE:                                                                                                                                                                                                         SUBJECTIVE STATEMENT: 04/06/2024 "I am feeling pretty okay today. I did a lot of deliveries today to purposely test my shoulders/ neck which I do notice some soreness. I have been feeling better the last few days." He reports he is much more sore in the AM, he has tried adjusting the pillows."  EVAL: Pt presents to PT with reports of chronic R sided neck pain that refers into R UE. Notes N/T in R forearm and R hand, denies bowel/bladder  changes or saddle anesthesia. Pt was recently laid off from his job, before this he had difficulty with lifting 25# and repetitive motions that exacerbated pain. He has had therapy in the past but states that they mainly focused on his shoulder and his neck has felt like the primary issue.   Hand dominance: Right  PERTINENT HISTORY:  None  PAIN:  Are you having pain?  Yes: NPRS  scale: 4/10 Pain location: R upper trap, R cervical paraspinals, R forearm Pain description: tight, achy, N/T Aggravating factors: lifting, turning head, prolonged sitting Relieving factors: none  PRECAUTIONS: None  RED FLAGS: None     WEIGHT BEARING RESTRICTIONS: No  FALLS:  Has patient fallen in last 6 months? Yes. Number of falls - 2 while a   LIVING ENVIRONMENT: Lives with: lives alone Lives in: House/apartment  OCCUPATION: Not currently working  PLOF: Independent  PATIENT GOALS: decrease neck pain, be able to lift 25lbs objects without discomfort  OBJECTIVE:  Note: Objective measures were completed at Evaluation unless otherwise noted.  DIAGNOSTIC FINDINGS:  See imaging   PATIENT SURVEYS:  NDI: 22/50  COGNITION: Overall cognitive status: Within functional limits for tasks assessed  SENSATION: Light touch: Impaired - R dorsum of hand  POSTURE: rounded shoulders and forward head  PALPATION: TTP to R upper trap, R levator, R wrist extensors   CERVICAL ROM:   Active ROM A/PROM (deg) eval  Flexion   Extension   Right lateral flexion   Left lateral flexion   Right rotation 40  Left rotation 60   (Blank rows = not tested)  UPPER EXTREMITY ROM:  Active ROM Right eval Left eval  Shoulder flexion [facility]  Shoulder extension    Shoulder abduction [facility]  Shoulder adduction    Shoulder extension    Shoulder internal rotation    Shoulder external rotation    Elbow flexion    Elbow extension    Wrist flexion    Wrist extension    Wrist ulnar deviation    Wrist radial  deviation    Wrist pronation    Wrist supination     (Blank rows = not tested)  UPPER EXTREMITY MMT:  MMT Right eval Left eval  Shoulder flexion [facility]  Shoulder extension    Shoulder abduction [facility]  Shoulder adduction    Shoulder extension    Shoulder internal rotation    Shoulder external rotation    Middle trapezius 3+/5 3+/5  Lower trapezius 3+/5 3+/5  Elbow flexion    Elbow extension    Wrist flexion    Wrist extension    Wrist ulnar deviation    Wrist radial deviation    Wrist pronation    Wrist supination    Grip strength 90lbs 105lbs   (Blank rows = not tested)  CERVICAL SPECIAL TESTS:  Upper limb tension test (ULTT): Negative  FUNCTIONAL TESTS:  Deep Cervical Flexor Endurance: 12 sec  TREATMENT: [facility] Adult PT Treatment:                                                DATE: 04/06/2024 R upper trap inhibition taping Pec stretch using corner for bil UE 1 x 30 sec, then one arm at a time at 90/90 1 x 30 sec ea.  Chest press with dowel with plus for serratus activation 2 x 15 Anatomy of the area involved and effects of posture/ upper crossed syndrome and proper muscles to strengthening / stretch Scapular retraction with ER 2 x 10  Lower trap activation with elbows resting on bolster   [facility] Adult PT Treatment:  DATE: 03/30/2024 MTPR along the R upper trap/ levator scapulae First rib mobs with belt 1 x 5, then performing with movement pulling with exhalation and off with inhalations.  R upper trap stretch and levator scapulae stretch 2 x 30 ea.  SNAGS, to promote R cervical gapping. With strap Strap cervical extension with movement R cervical lateral gapping with strap Pain education hurt vs harm, and continue to stay within pain free range Posture / sleeping position Updated HEP today.   [facility] Adult PT Treatment:                                                DATE: 03/23/2024 Sub-occpitial release MTPR along the  R upper trap, reviewed how to perform at home.  Traction using saunders unit x 5 min 15# of pressure.  Cervical Snags to promote R vertebral gapping Seated chin tuck  Reviewed using pain as guide and backing off to prevent increased tension Reviewed seated posture to promote efficient posture   PATIENT EDUCATION:  Education details: eval findings, NDI, HEP, POC Person educated: Patient Education method: Explanation, Demonstration, and Handouts Education comprehension: verbalized understanding and returned demonstration  HOME EXERCISE PROGRAM: Access Code: [email or web address] URL: [email or web address] Date: 04/06/2024 Prepared by: Laron Plummer  Program Notes to work on strengthening of the lower trap, you can do this seated with your elbows resting on a table or physioball with palms facing you and pulling the hands apart.  2 - 3 sets 10 -12 reps with red theraband.   Exercises - Seated Wrist Flexion Stretch  - 1 x daily - 7 x weekly - 3 reps - 30 sec hold - Seated Upper Trapezius Stretch  - 1 x daily - 7 x weekly - 3 reps - 30 sec hold - Seated Scapular Retraction  - 1 x daily - 7 x weekly - 2 sets - 10 reps - 3 sec hold - Standing Shoulder Row with Anchored Resistance  - 1 x daily - 7 x weekly - 3 sets - 10 reps - black band hold - Supine Chin Tuck  - 1 x daily - 7 x weekly - 2 sets - 10 reps - 5 sec hold - Cervical SNAG Rotation (Mirrored)  - 1 x daily - 7 x weekly - 2 sets - 10 reps - 3- 5 sec hold - Seated or Standing Cervical Retraction  - 1 x daily - 7 x weekly - 2 sets - 10 reps - 5- 10 sec hold - Gentle Levator Scapulae Stretch (Mirrored)  - 1 x daily - 7 x weekly - 2 sets - 2 reps - 30 seconds hold - 3 Finger Cervical Rotation  - 1 x daily - 7 x weekly - 2 sets - 10 reps - Single Arm Doorway Pec Stretch at 90 Degrees Abduction  - 1 x daily - 7 x weekly - 2 sets - 2 reps - 30 sec  hold - Scapular retraction with ER (MONEY)  - 1 x daily - 7 x weekly - 3 sets - 10  reps - Supine Shoulder Press AAROM in Abduction with Dowel  - 1 x daily - 7 x weekly - 3 sets - 10 reps  ASSESSMENT:  CLINICAL IMPRESSION: 04/06/2024 Mr Couper arrives to session noting he feels like over the last few days he is doing better, and was able to perform  his work tasks without having as elevated pain compared to previous times. Reviewed posture and effects of upper crossed syndrome and benefits of proper stretching/ strengthening. He had planned to get an MRI but that was denied by insurance. Focused on stretching pecs and activation lower posterior chain (upper trap/ serratus anterior). End of session he noted no increase in pain.   EVAL: Patient is a 53 y.o. M who was seen today for physical therapy evaluation and treatment for chronic neck and UE pain. Physical findings are consistent with referring provider impression as pt demonstrates decrease in cervical ROM and UE function. NDI score severe disability in performance of home ADLs and community activities. Pt would benefit from skilled PT services working on improving cervical ROM, DNF endurance, and postural muscle strength in order to decrease pain and improve function.   OBJECTIVE IMPAIRMENTS: decreased activity tolerance, decreased mobility, difficulty walking, decreased ROM, decreased strength, impaired UE functional use, postural dysfunction, and pain.   ACTIVITY LIMITATIONS: carrying, lifting, sitting, and reach over head  PARTICIPATION LIMITATIONS: driving, shopping, community activity, occupation, and yard work  PERSONAL FACTORS: Time since onset of injury/illness/exacerbation are also affecting patient's functional outcome.   REHAB POTENTIAL: Excellent  CLINICAL DECISION MAKING: Stable/uncomplicated  EVALUATION COMPLEXITY: Low   GOALS: Goals reviewed with patient? No  SHORT TERM GOALS: Target date: 04/01/2024   Pt will be compliant and knowledgeable with initial HEP for improved comfort and  carryover Baseline: initial HEP given  Goal status: MET  2.  Pt will self report neck and R UE pain no greater than 6/10 for improved comfort and functional ability Baseline: 10/10 at worst Goal status: Ongoing  LONG TERM GOALS: Target date: 05/06/2024   Pt will decrease NDI disability score to no greater than 11/50 as proxy for functional improvement Baseline: 22/50 Goal status: INITIAL   2.  Pt will self report neck and R UE pain no greater than 3/10 for improved comfort and functional ability Baseline: 10/10 at worst Goal status: INITIAL   3.  Pt will improve R cervical rotation AROM to no less than 60 degrees for improved functional ability and comfort with home ADLs and driving  Baseline: 40 seconds Goal status: INITIAL  4.  Pt will improve bilateral middle/lower trap strength to at least 4/5 for improved postural strength and decreased neck pain Baseline: 3+/5 Goal status: INITIAL  5.  Pt will improve R grip strength to at least 105lbs for improved functional ability with grasping tasks Baseline: 90lbs Goal status: INITIAL   PLAN:  PT FREQUENCY: 2x/week  PT DURATION: 8 weeks  PLANNED INTERVENTIONS: 97164- PT Re-evaluation, 97110-Therapeutic exercises, 97530- Therapeutic activity, [id number]- Neuromuscular re-education, 97535- Self Care, [id number]- Manual therapy, G0283- Electrical stimulation (unattended), [id number]- Electrical stimulation (manual), Dry Needling, Cryotherapy, and Moist heat  PLAN FOR NEXT SESSION: assess HEP response, DNF and periscapular strengthening, manual for neck and R UE. DNF/ Pec stretching, lower trap activation/ serratus ant/ rows.  Look about adding iontophoresis to POC.   Delvis Kau PT, DPT, LAT, ATC  04/06/24  2:16 PM

## 2024-04-06 NOTE — Telephone Encounter (Signed)
 Called patient back regarding request for the information on the type of tape that was used in his treatment. I LVM informing it was cover roll (white tape), and leukotape the brown tape. If he has any questions to feel free to call us  back or send a mychart message.   Krishan Mcbreen PT, DPT, LAT, ATC  04/06/24  4:50 PM

## 2024-04-13 ENCOUNTER — Ambulatory Visit

## 2024-04-13 DIAGNOSIS — M542 Cervicalgia: Secondary | ICD-10-CM | POA: Diagnosis not present

## 2024-04-13 DIAGNOSIS — R293 Abnormal posture: Secondary | ICD-10-CM

## 2024-04-13 DIAGNOSIS — M6281 Muscle weakness (generalized): Secondary | ICD-10-CM

## 2024-04-13 NOTE — Therapy (Signed)
 OUTPATIENT PHYSICAL THERAPY TREATMENT   Patient Name: Eric Nunez MRN: [id number] DOB:November 17, 1971, 53 y.o., male Today's Date: 04/13/2024  END OF SESSION:  PT End of Session - 04/13/24 1150     Visit Number 6    Number of Visits 17    Date for PT Re-Evaluation 05/06/24    Authorization Type [facility]    Authorization - Visit Number 5    Authorization - Number of Visits 10    PT Start Time 1150    PT Stop Time 1228    PT Time Calculation (min) 38 min    Activity Tolerance Patient tolerated treatment well    Behavior During Therapy [facility] for tasks assessed/performed                Past Medical History:  Diagnosis Date   Deafness    right ear   Elevated cholesterol 03/2020   Elevated TSH 03/2020   Right shoulder pain 03/2020   Past Surgical History:  Procedure Laterality Date   APPENDECTOMY     left knee repair     Patient Active Problem List   Diagnosis Date Noted   Right shoulder pain 04/16/2020   Thumb pain, right 04/16/2020   Encounter for follow-up 04/16/2020    PCP: Collins Dean, NP  REFERRING PROVIDER: Sandie Cross, PA-C  REFERRING DIAG:  M54.2 (ICD-10-CM) - Neck pain M25.511,G89.29 (ICD-10-CM) - Chronic right shoulder pain  THERAPY DIAG:  Cervicalgia  Muscle weakness (generalized)  Abnormal posture  Rationale for Evaluation and Treatment: Rehabilitation  ONSET DATE: Chronic  SUBJECTIVE:                                                                                                                                                                                                         SUBJECTIVE STATEMENT: 04/13/2024 Pt presents to PT with reports of R wrist extensor pain after swatting at a fly the previous day. Has been compliant with HEP.   EVAL: Pt presents to PT with reports of chronic R sided neck pain that refers into R UE. Notes N/T in R forearm and R hand, denies bowel/bladder changes or saddle anesthesia. Pt  was recently laid off from his job, before this he had difficulty with lifting 25# and repetitive motions that exacerbated pain. He has had therapy in the past but states that they mainly focused on his shoulder and his neck has felt like the primary issue.   Hand dominance: Right  PERTINENT HISTORY:  None  PAIN:  Are you having pain?  Yes: NPRS  scale: 4/10 Pain location: R upper trap, R cervical paraspinals, R forearm Pain description: tight, achy, N/T Aggravating factors: lifting, turning head, prolonged sitting Relieving factors: none  PRECAUTIONS: None  RED FLAGS: None     WEIGHT BEARING RESTRICTIONS: No  FALLS:  Has patient fallen in last 6 months? Yes. Number of falls - 2 while a   LIVING ENVIRONMENT: Lives with: lives alone Lives in: House/apartment  OCCUPATION: Not currently working  PLOF: Independent  PATIENT GOALS: decrease neck pain, be able to lift 25lbs objects without discomfort  OBJECTIVE:  Note: Objective measures were completed at Evaluation unless otherwise noted.  DIAGNOSTIC FINDINGS:  See imaging   PATIENT SURVEYS:  NDI: 22/50  COGNITION: Overall cognitive status: Within functional limits for tasks assessed  SENSATION: Light touch: Impaired - R dorsum of hand  POSTURE: rounded shoulders and forward head  PALPATION: TTP to R upper trap, R levator, R wrist extensors   CERVICAL ROM:   Active ROM A/PROM (deg) eval  Flexion   Extension   Right lateral flexion   Left lateral flexion   Right rotation 40  Left rotation 60   (Blank rows = not tested)  UPPER EXTREMITY ROM:  Active ROM Right eval Left eval  Shoulder flexion [facility]  Shoulder extension    Shoulder abduction [facility]  Shoulder adduction    Shoulder extension    Shoulder internal rotation    Shoulder external rotation    Elbow flexion    Elbow extension    Wrist flexion    Wrist extension    Wrist ulnar deviation    Wrist radial deviation    Wrist pronation     Wrist supination     (Blank rows = not tested)  UPPER EXTREMITY MMT:  MMT Right eval Left eval  Shoulder flexion [facility]  Shoulder extension    Shoulder abduction [facility]  Shoulder adduction    Shoulder extension    Shoulder internal rotation    Shoulder external rotation    Middle trapezius 3+/5 3+/5  Lower trapezius 3+/5 3+/5  Elbow flexion    Elbow extension    Wrist flexion    Wrist extension    Wrist ulnar deviation    Wrist radial deviation    Wrist pronation    Wrist supination    Grip strength 90lbs 105lbs   (Blank rows = not tested)  CERVICAL SPECIAL TESTS:  Upper limb tension test (ULTT): Negative  FUNCTIONAL TESTS:  Deep Cervical Flexor Endurance: 12 sec  TREATMENT: [facility] Adult PT Treatment:                                                DATE: 04/13/2024 Supine shoulder flexion 2x10 R 2# Supine chin tuck x 10 - 5" hold Standing row 2x10 GTB  Standing ER 2x10 YTB Seated low row 2x10 25# Manual Therapy: STM and trigger point release to R wrist extensors  [facility] Adult PT Treatment:                                                DATE: 04/06/2024 R upper trap inhibition taping Pec stretch using corner for bil UE 1 x 30 sec, then one arm at a  time at 90/90 1 x 30 sec ea.  Chest press with dowel with plus for serratus activation 2 x 15 Anatomy of the area involved and effects of posture/ upper crossed syndrome and proper muscles to strengthening / stretch Scapular retraction with ER 2 x 10  Lower trap activation with elbows resting on bolster   [facility] Adult PT Treatment:                                                DATE: 03/30/2024 MTPR along the R upper trap/ levator scapulae First rib mobs with belt 1 x 5, then performing with movement pulling with exhalation and off with inhalations.  R upper trap stretch and levator scapulae stretch 2 x 30 ea.  SNAGS, to promote R cervical gapping. With strap Strap cervical extension with movement R cervical lateral  gapping with strap Pain education hurt vs harm, and continue to stay within pain free range Posture / sleeping position Updated HEP today.   [facility] Adult PT Treatment:                                                DATE: 03/23/2024 Sub-occpitial release MTPR along the R upper trap, reviewed how to perform at home.  Traction using saunders unit x 5 min 15# of pressure.  Cervical Snags to promote R vertebral gapping Seated chin tuck  Reviewed using pain as guide and backing off to prevent increased tension Reviewed seated posture to promote efficient posture   PATIENT EDUCATION:  Education details: continue HEP Person educated: Patient Education method: Explanation, Demonstration, and Handouts Education comprehension: verbalized understanding and returned demonstration  HOME EXERCISE PROGRAM: Access Code: [email or web address] URL: [email or web address] Date: 04/06/2024 Prepared by: Laron Plummer  Program Notes to work on strengthening of the lower trap, you can do this seated with your elbows resting on a table or physioball with palms facing you and pulling the hands apart.  2 - 3 sets 10 -12 reps with red theraband.   Exercises - Seated Wrist Flexion Stretch  - 1 x daily - 7 x weekly - 3 reps - 30 sec hold - Seated Upper Trapezius Stretch  - 1 x daily - 7 x weekly - 3 reps - 30 sec hold - Seated Scapular Retraction  - 1 x daily - 7 x weekly - 2 sets - 10 reps - 3 sec hold - Standing Shoulder Row with Anchored Resistance  - 1 x daily - 7 x weekly - 3 sets - 10 reps - black band hold - Supine Chin Tuck  - 1 x daily - 7 x weekly - 2 sets - 10 reps - 5 sec hold - Cervical SNAG Rotation (Mirrored)  - 1 x daily - 7 x weekly - 2 sets - 10 reps - 3- 5 sec hold - Seated or Standing Cervical Retraction  - 1 x daily - 7 x weekly - 2 sets - 10 reps - 5- 10 sec hold - Gentle Levator Scapulae Stretch (Mirrored)  - 1 x daily - 7 x weekly - 2 sets - 2 reps - 30 seconds hold - 3 Finger  Cervical Rotation  - 1 x daily - 7 x weekly - 2 sets -  10 reps - Single Arm Doorway Pec Stretch at 90 Degrees Abduction  - 1 x daily - 7 x weekly - 2 sets - 2 reps - 30 sec  hold - Scapular retraction with ER (MONEY)  - 1 x daily - 7 x weekly - 3 sets - 10 reps - Supine Shoulder Press AAROM in Abduction with Dowel  - 1 x daily - 7 x weekly - 3 sets - 10 reps  ASSESSMENT:  CLINICAL IMPRESSION: 04/13/2024 Pt was able to complete prescribed exercises with no adverse effect. Exercises focused on periscapular and DNF strengthening in order to decrease neck and shoulder pain.   EVAL: Patient is a 53 y.o. M who was seen today for physical therapy evaluation and treatment for chronic neck and UE pain. Physical findings are consistent with referring provider impression as pt demonstrates decrease in cervical ROM and UE function. NDI score severe disability in performance of home ADLs and community activities. Pt would benefit from skilled PT services working on improving cervical ROM, DNF endurance, and postural muscle strength in order to decrease pain and improve function.   OBJECTIVE IMPAIRMENTS: decreased activity tolerance, decreased mobility, difficulty walking, decreased ROM, decreased strength, impaired UE functional use, postural dysfunction, and pain.   ACTIVITY LIMITATIONS: carrying, lifting, sitting, and reach over head  PARTICIPATION LIMITATIONS: driving, shopping, community activity, occupation, and yard work  PERSONAL FACTORS: Time since onset of injury/illness/exacerbation are also affecting patient's functional outcome.   REHAB POTENTIAL: Excellent  CLINICAL DECISION MAKING: Stable/uncomplicated  EVALUATION COMPLEXITY: Low   GOALS: Goals reviewed with patient? No  SHORT TERM GOALS: Target date: 04/01/2024   Pt will be compliant and knowledgeable with initial HEP for improved comfort and carryover Baseline: initial HEP given  Goal status: MET  2.  Pt will self report neck and  R UE pain no greater than 6/10 for improved comfort and functional ability Baseline: 10/10 at worst Goal status: Ongoing  LONG TERM GOALS: Target date: 05/06/2024   Pt will decrease NDI disability score to no greater than 11/50 as proxy for functional improvement Baseline: 22/50 Goal status: INITIAL   2.  Pt will self report neck and R UE pain no greater than 3/10 for improved comfort and functional ability Baseline: 10/10 at worst Goal status: INITIAL   3.  Pt will improve R cervical rotation AROM to no less than 60 degrees for improved functional ability and comfort with home ADLs and driving  Baseline: 40 seconds Goal status: INITIAL  4.  Pt will improve bilateral middle/lower trap strength to at least 4/5 for improved postural strength and decreased neck pain Baseline: 3+/5 Goal status: INITIAL  5.  Pt will improve R grip strength to at least 105lbs for improved functional ability with grasping tasks Baseline: 90lbs Goal status: INITIAL   PLAN:  PT FREQUENCY: 2x/week  PT DURATION: 8 weeks  PLANNED INTERVENTIONS: 97164- PT Re-evaluation, 97110-Therapeutic exercises, 97530- Therapeutic activity, [id number]- Neuromuscular re-education, 97535- Self Care, 13086- Manual therapy, G0283- Electrical stimulation (unattended), [id number]- Electrical stimulation (manual), Dry Needling, Cryotherapy, and Moist heat  PLAN FOR NEXT SESSION: assess HEP response, DNF and periscapular strengthening, manual for neck and R UE. DNF/ Pec stretching, lower trap activation/ serratus ant/ rows.  Look about adding iontophoresis to POC.  Ivor Mars PT  04/13/24 1:07 PM

## 2024-04-18 ENCOUNTER — Ambulatory Visit

## 2024-04-27 ENCOUNTER — Ambulatory Visit: Attending: Physician Assistant | Admitting: Physical Therapy

## 2024-04-27 DIAGNOSIS — M6281 Muscle weakness (generalized): Secondary | ICD-10-CM | POA: Diagnosis present

## 2024-04-27 DIAGNOSIS — M542 Cervicalgia: Secondary | ICD-10-CM | POA: Insufficient documentation

## 2024-04-27 DIAGNOSIS — R293 Abnormal posture: Secondary | ICD-10-CM | POA: Insufficient documentation

## 2024-04-27 NOTE — Therapy (Signed)
 OUTPATIENT PHYSICAL THERAPY TREATMENT   Patient Name: Jeremia Panton MRN: [id number] DOB:[birth year], 53 y.o., male Today's Date: 04/27/2024  END OF SESSION:  PT End of Session - 04/27/24 1428     Visit Number 7    Number of Visits 17    Date for PT Re-Evaluation 05/06/24    PT Start Time 1424    PT Stop Time 1514    PT Time Calculation (min) 50 min    Activity Tolerance Patient tolerated treatment well    Behavior During Therapy [facility] for tasks assessed/performed                 Past Medical History:  Diagnosis Date   Deafness    right ear   Elevated cholesterol 03/2020   Elevated TSH 03/2020   Right shoulder pain 03/2020   Past Surgical History:  Procedure Laterality Date   APPENDECTOMY     left knee repair     Patient Active Problem List   Diagnosis Date Noted   Right shoulder pain 04/16/2020   Thumb pain, right 04/16/2020   Encounter for follow-up 04/16/2020    PCP: Collins Dean, NP  REFERRING PROVIDER: Sandie Cross, PA-C  REFERRING DIAG:  M54.2 (ICD-10-CM) - Neck pain M25.511,G89.29 (ICD-10-CM) - Chronic right shoulder pain  THERAPY DIAG:  Cervicalgia  Muscle weakness (generalized)  Abnormal posture  Rationale for Evaluation and Treatment: Rehabilitation  ONSET DATE: Chronic  SUBJECTIVE:                                                                                                                                                                                                         SUBJECTIVE STATEMENT: 04/27/2024 " I am doing pretty better, and have trialed other ways to stretch to help with my limited space at home. I still get sore but I notice its not getting worse than a 1-2/10 compared to 8-9/10 when I first started."  EVAL: Pt presents to PT with reports of chronic R sided neck pain that refers into R UE. Notes N/T in R forearm and R hand, denies bowel/bladder changes or saddle anesthesia. Pt was recently laid off from  his job, before this he had difficulty with lifting 25# and repetitive motions that exacerbated pain. He has had therapy in the past but states that they mainly focused on his shoulder and his neck has felt like the primary issue.   Hand dominance: Right  PERTINENT HISTORY:  None  PAIN:  Are you having pain?  Yes: NPRS scale: 1-2/10 Pain location: R upper  trap, R cervical paraspinals, R forearm Pain description: tight, achy, N/T Aggravating factors: lifting, turning head, prolonged sitting Relieving factors: none  PRECAUTIONS: None  RED FLAGS: None     WEIGHT BEARING RESTRICTIONS: No  FALLS:  Has patient fallen in last 6 months? Yes. Number of falls - 2 while a   LIVING ENVIRONMENT: Lives with: lives alone Lives in: House/apartment  OCCUPATION: Not currently working  PLOF: Independent  PATIENT GOALS: decrease neck pain, be able to lift 25lbs objects without discomfort  OBJECTIVE:  Note: Objective measures were completed at Evaluation unless otherwise noted.  DIAGNOSTIC FINDINGS:  See imaging   PATIENT SURVEYS:  NDI: 22/50  COGNITION: Overall cognitive status: Within functional limits for tasks assessed  SENSATION: Light touch: Impaired - R dorsum of hand  POSTURE: rounded shoulders and forward head  PALPATION: TTP to R upper trap, R levator, R wrist extensors   CERVICAL ROM:   Active ROM A/PROM (deg) eval  Flexion   Extension   Right lateral flexion   Left lateral flexion   Right rotation 40  Left rotation 60   (Blank rows = not tested)  UPPER EXTREMITY ROM:  Active ROM Right eval Left eval  Shoulder flexion [facility]  Shoulder extension    Shoulder abduction [facility]  Shoulder adduction    Shoulder extension    Shoulder internal rotation    Shoulder external rotation    Elbow flexion    Elbow extension    Wrist flexion    Wrist extension    Wrist ulnar deviation    Wrist radial deviation    Wrist pronation    Wrist supination      (Blank rows = not tested)  UPPER EXTREMITY MMT:  MMT Right eval Left eval  Shoulder flexion [facility]  Shoulder extension    Shoulder abduction [facility]  Shoulder adduction    Shoulder extension    Shoulder internal rotation    Shoulder external rotation    Middle trapezius 3+/5 3+/5  Lower trapezius 3+/5 3+/5  Elbow flexion    Elbow extension    Wrist flexion    Wrist extension    Wrist ulnar deviation    Wrist radial deviation    Wrist pronation    Wrist supination    Grip strength 90lbs 105lbs   (Blank rows = not tested)  CERVICAL SPECIAL TESTS:  Upper limb tension test (ULTT): Negative  FUNCTIONAL TESTS:  Deep Cervical Flexor Endurance: 12 sec  TREATMENT: [facility] Adult PT Treatment:                                                DATE: 04/27/24 Lower trap activation/ review with Red theraband Supine pec stretch with rolled up towel and scapular retraction Rhomboid stretch 2 x 30 at sink MTPR along the sub-clavius Distal clavicle mobs with strap First rib mobs with strap Lower trap strengthening with bil shoulder flexion 1x10 Updated HEP  [facility] Adult PT Treatment:                                                DATE: 04/13/2024 Supine shoulder flexion 2x10 R 2# Supine chin tuck x 10 - 5" hold Standing row 2x10 GTB  Standing ER 2x10 YTB Seated low row 2x10 25# Manual Therapy: STM and trigger point release to R wrist extensors  [facility] Adult PT Treatment:                                                DATE: 04/06/2024 R upper trap inhibition taping Pec stretch using corner for bil UE 1 x 30 sec, then one arm at a time at 90/90 1 x 30 sec ea.  Chest press with dowel with plus for serratus activation 2 x 15 Anatomy of the area involved and effects of posture/ upper crossed syndrome and proper muscles to strengthening / stretch Scapular retraction with ER 2 x 10  Lower trap activation with elbows resting on bolster  PATIENT EDUCATION:  Education details: continue  HEP Person educated: Patient Education method: Explanation, Demonstration, and Handouts Education comprehension: verbalized understanding and returned demonstration  HOME EXERCISE PROGRAM: Access Code: [email or web address] URL: [email or web address] Date: 04/27/2024 Prepared by: Laron Plummer  Program Notes to work on strengthening of the lower trap, you can do this seated with your elbows resting on a table or physioball with palms facing you and pulling the hands apart.  2 - 3 sets 10 -12 reps with red theraband.   Exercises - Seated Wrist Flexion Stretch  - 1 x daily - 7 x weekly - 3 reps - 30 sec hold - Seated Upper Trapezius Stretch  - 1 x daily - 7 x weekly - 3 reps - 30 sec hold - Seated Scapular Retraction  - 1 x daily - 7 x weekly - 2 sets - 10 reps - 3 sec hold - Standing Shoulder Row with Anchored Resistance  - 1 x daily - 7 x weekly - 3 sets - 10 reps - black band hold - Supine Chin Tuck  - 1 x daily - 7 x weekly - 2 sets - 10 reps - 5 sec hold - Cervical SNAG Rotation (Mirrored)  - 1 x daily - 7 x weekly - 2 sets - 10 reps - 3- 5 sec hold - Seated or Standing Cervical Retraction  - 1 x daily - 7 x weekly - 2 sets - 10 reps - 5- 10 sec hold - Gentle Levator Scapulae Stretch (Mirrored)  - 1 x daily - 7 x weekly - 2 sets - 2 reps - 30 seconds hold - 3 Finger Cervical Rotation  - 1 x daily - 7 x weekly - 2 sets - 10 reps - Single Arm Doorway Pec Stretch at 90 Degrees Abduction  - 1 x daily - 7 x weekly - 2 sets - 2 reps - 30 sec  hold - Scapular retraction with ER (MONEY)  - 1 x daily - 7 x weekly - 3 sets - 10 reps - Supine Shoulder Press AAROM in Abduction with Dowel  - 1 x daily - 7 x weekly - 3 sets - 10 reps - Supine Chest Stretch on Foam Roll  - 1 x daily - 7 x weekly - 2 sets - 2-3 reps - 30 sec  hold - Seated Rhomboid Stretch  - 1 x daily - 7 x weekly - 2 sets - 2-3 reps - 30 sec hold - First Rib Mobilization with Strap (Mirrored)  - 1 x daily - 2 sets - 10 reps -  5 hold  ASSESSMENT:  CLINICAL  IMPRESSION: 04/27/2024 Mr Pearce continues to make steady progress with physical therapy noting improvements in pain and postural awareness. Continued working on strengthening for the poster chain and stretch of the pec, and promoting first rib and distal clavicle mobility. End of session he noted feeling better.   EVAL: Patient is a 54 y.o. M who was seen today for physical therapy evaluation and treatment for chronic neck and UE pain. Physical findings are consistent with referring provider impression as pt demonstrates decrease in cervical ROM and UE function. NDI score severe disability in performance of home ADLs and community activities. Pt would benefit from skilled PT services working on improving cervical ROM, DNF endurance, and postural muscle strength in order to decrease pain and improve function.   OBJECTIVE IMPAIRMENTS: decreased activity tolerance, decreased mobility, difficulty walking, decreased ROM, decreased strength, impaired UE functional use, postural dysfunction, and pain.   ACTIVITY LIMITATIONS: carrying, lifting, sitting, and reach over head  PARTICIPATION LIMITATIONS: driving, shopping, community activity, occupation, and yard work  PERSONAL FACTORS: Time since onset of injury/illness/exacerbation are also affecting patient's functional outcome.   REHAB POTENTIAL: Excellent  CLINICAL DECISION MAKING: Stable/uncomplicated  EVALUATION COMPLEXITY: Low   GOALS: Goals reviewed with patient? No  SHORT TERM GOALS: Target date: 04/01/2024   Pt will be compliant and knowledgeable with initial HEP for improved comfort and carryover Baseline: initial HEP given  Goal status: MET  2.  Pt will self report neck and R UE pain no greater than 6/10 for improved comfort and functional ability Baseline: 10/10 at worst Goal status: Ongoing  LONG TERM GOALS: Target date: 05/06/2024   Pt will decrease NDI disability score to no greater than 11/50  as proxy for functional improvement Baseline: 22/50 Goal status: INITIAL   2.  Pt will self report neck and R UE pain no greater than 3/10 for improved comfort and functional ability Baseline: 10/10 at worst Goal status: INITIAL   3.  Pt will improve R cervical rotation AROM to no less than 60 degrees for improved functional ability and comfort with home ADLs and driving  Baseline: 40 seconds Goal status: INITIAL  4.  Pt will improve bilateral middle/lower trap strength to at least 4/5 for improved postural strength and decreased neck pain Baseline: 3+/5 Goal status: INITIAL  5.  Pt will improve R grip strength to at least 105lbs for improved functional ability with grasping tasks Baseline: 90lbs Goal status: INITIAL   PLAN:  PT FREQUENCY: 2x/week  PT DURATION: 8 weeks  PLANNED INTERVENTIONS: 97164- PT Re-evaluation, 97110-Therapeutic exercises, 97530- Therapeutic activity, [id number]- Neuromuscular re-education, 97535- Self Care, 95638- Manual therapy, [id number]- Electrical stimulation (unattended), [id number]- Electrical stimulation (manual), Dry Needling, Cryotherapy, and Moist heat  PLAN FOR NEXT SESSION: assess HEP response, DNF and periscapular strengthening, manual for neck and R UE. DNF/ Pec stretching, lower trap activation/ serratus ant/ rows.  Look about adding iontophoresis to POC , renew next session and trial foam roll towel routine.   Sabatino Williard PT, DPT, LAT, ATC  04/27/24  3:25 PM

## 2024-05-04 ENCOUNTER — Ambulatory Visit: Admitting: Physical Therapy

## 2024-05-13 ENCOUNTER — Ambulatory Visit

## 2024-05-13 DIAGNOSIS — M6281 Muscle weakness (generalized): Secondary | ICD-10-CM

## 2024-05-13 DIAGNOSIS — R293 Abnormal posture: Secondary | ICD-10-CM

## 2024-05-13 DIAGNOSIS — M542 Cervicalgia: Secondary | ICD-10-CM | POA: Diagnosis not present

## 2024-05-13 NOTE — Therapy (Signed)
 OUTPATIENT PHYSICAL THERAPY TREATMENT   Patient Name: Eric Nunez MRN: [id number] DOB:09-23-1971, 53 y.o., male Today's Date: 05/13/2024  END OF SESSION:  PT End of Session - 05/13/24 0758     Visit Number 8    Number of Visits 17    Date for PT Re-Evaluation 06/15/24    Authorization Type [employer]    Authorization Time Period 3/24 -  05/13/24    Authorization - Visit Number 6    Authorization - Number of Visits 10    PT Start Time 0800    PT Stop Time 0844    PT Time Calculation (min) 44 min    Activity Tolerance Patient tolerated treatment well    Behavior During Therapy [facility] for tasks assessed/performed                  Past Medical History:  Diagnosis Date   Deafness    right ear   Elevated cholesterol 03/2020   Elevated TSH 03/2020   Right shoulder pain 03/2020   Past Surgical History:  Procedure Laterality Date   APPENDECTOMY     left knee repair     Patient Active Problem List   Diagnosis Date Noted   Right shoulder pain 04/16/2020   Thumb pain, right 04/16/2020   Encounter for follow-up 04/16/2020    PCP: Collins Dean, NP  REFERRING PROVIDER: Sandie Cross, PA-C  REFERRING DIAG:  M54.2 (ICD-10-CM) - Neck pain M25.511,G89.29 (ICD-10-CM) - Chronic right shoulder pain  THERAPY DIAG:  Cervicalgia  Muscle weakness (generalized)  Abnormal posture  Rationale for Evaluation and Treatment: Rehabilitation  ONSET DATE: Chronic  SUBJECTIVE:                                                                                                                                                                                                         SUBJECTIVE STATEMENT: Pt presents to PT with continued symptoms. Has continued HEP compliance.   EVAL: Pt presents to PT with reports of chronic R sided neck pain that refers into R UE. Notes N/T in R forearm and R hand, denies bowel/bladder changes or saddle anesthesia. Pt was recently  laid off from his job, before this he had difficulty with lifting 25# and repetitive motions that exacerbated pain. He has had therapy in the past but states that they mainly focused on his shoulder and his neck has felt like the primary issue.   Hand dominance: Right  PERTINENT HISTORY:  None  PAIN:  Are you having pain?  Yes: NPRS scale: 1-2/10  Pain location: R upper trap, R cervical paraspinals, R forearm Pain description: tight, achy, N/T Aggravating factors: lifting, turning head, prolonged sitting Relieving factors: none  PRECAUTIONS: None  RED FLAGS: None     WEIGHT BEARING RESTRICTIONS: No  FALLS:  Has patient fallen in last 6 months? Yes. Number of falls - 2 while a   LIVING ENVIRONMENT: Lives with: lives alone Lives in: House/apartment  OCCUPATION: Not currently working  PLOF: Independent  PATIENT GOALS: decrease neck pain, be able to lift 25lbs objects without discomfort  OBJECTIVE:  Note: Objective measures were completed at Evaluation unless otherwise noted.  DIAGNOSTIC FINDINGS:  See imaging   PATIENT SURVEYS:  NDI: 22/50  COGNITION: Overall cognitive status: Within functional limits for tasks assessed  SENSATION: Light touch: Impaired - R dorsum of hand  POSTURE: rounded shoulders and forward head  PALPATION: TTP to R upper trap, R levator, R wrist extensors   CERVICAL ROM:   Active ROM A/PROM (deg) eval  Flexion   Extension   Right lateral flexion   Left lateral flexion   Right rotation 40  Left rotation 60   (Blank rows = not tested)  UPPER EXTREMITY ROM:  Active ROM Right eval Left eval  Shoulder flexion [facility]  Shoulder extension    Shoulder abduction [facility]  Shoulder adduction    Shoulder extension    Shoulder internal rotation    Shoulder external rotation    Elbow flexion    Elbow extension    Wrist flexion    Wrist extension    Wrist ulnar deviation    Wrist radial deviation    Wrist pronation    Wrist  supination     (Blank rows = not tested)  UPPER EXTREMITY MMT:  MMT Right eval Left eval  Shoulder flexion [facility]  Shoulder extension    Shoulder abduction [facility]  Shoulder adduction    Shoulder extension    Shoulder internal rotation    Shoulder external rotation    Middle trapezius 3+/5 3+/5  Lower trapezius 3+/5 3+/5  Elbow flexion    Elbow extension    Wrist flexion    Wrist extension    Wrist ulnar deviation    Wrist radial deviation    Wrist pronation    Wrist supination    Grip strength 90lbs 105lbs   (Blank rows = not tested)  CERVICAL SPECIAL TESTS:  Upper limb tension test (ULTT): Negative  FUNCTIONAL TESTS:  Deep Cervical Flexor Endurance: 12 sec  TREATMENT: [facility] Adult PT Treatment:                                                DATE: 05/13/24 Supine chin tuck x 10 - 3" hold Supine shoulder flexion 2x10 2# bilateral Inclined shoulder flexion 2x10 2# bilateral Supine horizontal abd 2x15 RTB Supine pec stretch with rolled up towel and scapular retraction Standing ER with chin tuck at wall 2x10 RTB Prone I/T 2x10 each Seated bilateral ER on wedge 2x10 RTB Manual Therapy: STM to cervical paraspinals Suboccipital release TPR to L levator  [facility] Adult PT Treatment:  DATE: 04/27/24 Lower trap activation/ review with Red theraband Supine pec stretch with rolled up towel and scapular retraction Rhomboid stretch 2 x 30 at sink MTPR along the sub-clavius Distal clavicle mobs with strap First rib mobs with strap Lower trap strengthening with bil shoulder flexion 1x10 Updated HEP  [facility] Adult PT Treatment:                                                DATE: 04/13/2024 Supine shoulder flexion 2x10 R 2# Supine chin tuck x 10 - 5" hold Standing row 2x10 GTB  Standing ER 2x10 YTB Seated low row 2x10 25# Manual Therapy: STM and trigger point release to R wrist extensors  [facility] Adult PT Treatment:                                                 DATE: 04/06/2024 R upper trap inhibition taping Pec stretch using corner for bil UE 1 x 30 sec, then one arm at a time at 90/90 1 x 30 sec ea.  Chest press with dowel with plus for serratus activation 2 x 15 Anatomy of the area involved and effects of posture/ upper crossed syndrome and proper muscles to strengthening / stretch Scapular retraction with ER 2 x 10  Lower trap activation with elbows resting on bolster  PATIENT EDUCATION:  Education details: continue HEP Person educated: Patient Education method: Explanation, Demonstration, and Handouts Education comprehension: verbalized understanding and returned demonstration  HOME EXERCISE PROGRAM: Access Code: [email or web address] URL: [email or web address] Date: 04/27/2024 Prepared by: Laron Plummer  Program Notes to work on strengthening of the lower trap, you can do this seated with your elbows resting on a table or physioball with palms facing you and pulling the hands apart.  2 - 3 sets 10 -12 reps with red theraband.   Exercises - Seated Wrist Flexion Stretch  - 1 x daily - 7 x weekly - 3 reps - 30 sec hold - Seated Upper Trapezius Stretch  - 1 x daily - 7 x weekly - 3 reps - 30 sec hold - Seated Scapular Retraction  - 1 x daily - 7 x weekly - 2 sets - 10 reps - 3 sec hold - Standing Shoulder Row with Anchored Resistance  - 1 x daily - 7 x weekly - 3 sets - 10 reps - black band hold - Supine Chin Tuck  - 1 x daily - 7 x weekly - 2 sets - 10 reps - 5 sec hold - Cervical SNAG Rotation (Mirrored)  - 1 x daily - 7 x weekly - 2 sets - 10 reps - 3- 5 sec hold - Seated or Standing Cervical Retraction  - 1 x daily - 7 x weekly - 2 sets - 10 reps - 5- 10 sec hold - Gentle Levator Scapulae Stretch (Mirrored)  - 1 x daily - 7 x weekly - 2 sets - 2 reps - 30 seconds hold - 3 Finger Cervical Rotation  - 1 x daily - 7 x weekly - 2 sets - 10 reps - Single Arm Doorway Pec Stretch at 90 Degrees Abduction  - 1 x  daily - 7 x weekly - 2 sets - 2 reps - 30  sec  hold - Scapular retraction with ER (MONEY)  - 1 x daily - 7 x weekly - 3 sets - 10 reps - Supine Shoulder Press AAROM in Abduction with Dowel  - 1 x daily - 7 x weekly - 3 sets - 10 reps - Supine Chest Stretch on Foam Roll  - 1 x daily - 7 x weekly - 2 sets - 2-3 reps - 30 sec  hold - Seated Rhomboid Stretch  - 1 x daily - 7 x weekly - 2 sets - 2-3 reps - 30 sec hold - First Rib Mobilization with Strap (Mirrored)  - 1 x daily - 2 sets - 10 reps - 5 hold  ASSESSMENT:  CLINICAL IMPRESSION: Pt was able to complete all prescribed exercises with no adverse effect. Today we continued to focus on improving DNF and periscapular strength as well as pec muscle length in order to decrease pain and discomfort. Pt is progressing with therapy, will continue to progress as able per POC.   EVAL: Patient is a 53 y.o. M who was seen today for physical therapy evaluation and treatment for chronic neck and UE pain. Physical findings are consistent with referring provider impression as pt demonstrates decrease in cervical ROM and UE function. NDI score severe disability in performance of home ADLs and community activities. Pt would benefit from skilled PT services working on improving cervical ROM, DNF endurance, and postural muscle strength in order to decrease pain and improve function.   OBJECTIVE IMPAIRMENTS: decreased activity tolerance, decreased mobility, difficulty walking, decreased ROM, decreased strength, impaired UE functional use, postural dysfunction, and pain.   ACTIVITY LIMITATIONS: carrying, lifting, sitting, and reach over head  PARTICIPATION LIMITATIONS: driving, shopping, community activity, occupation, and yard work  PERSONAL FACTORS: Time since onset of injury/illness/exacerbation are also affecting patient's functional outcome.   REHAB POTENTIAL: Excellent  CLINICAL DECISION MAKING: Stable/uncomplicated  EVALUATION COMPLEXITY:  Low   GOALS: Goals reviewed with patient? No  SHORT TERM GOALS: Target date: 04/01/2024   Pt will be compliant and knowledgeable with initial HEP for improved comfort and carryover Baseline: initial HEP given  Goal status: MET  2.  Pt will self report neck and R UE pain no greater than 6/10 for improved comfort and functional ability Baseline: 10/10 at worst Goal status: Ongoing  LONG TERM GOALS: Target date: 06/15/2024   Pt will decrease NDI disability score to no greater than 11/50 as proxy for functional improvement Baseline: 22/50 Goal status: INITIAL   2.  Pt will self report neck and R UE pain no greater than 3/10 for improved comfort and functional ability Baseline: 10/10 at worst Goal status: INITIAL   3.  Pt will improve R cervical rotation AROM to no less than 60 degrees for improved functional ability and comfort with home ADLs and driving  Baseline: 40 seconds Goal status: INITIAL  4.  Pt will improve bilateral middle/lower trap strength to at least 4/5 for improved postural strength and decreased neck pain Baseline: 3+/5 Goal status: INITIAL  5.  Pt will improve R grip strength to at least 105lbs for improved functional ability with grasping tasks Baseline: 90lbs Goal status: INITIAL   PLAN:  PT FREQUENCY: 2x/week  PT DURATION: 8 weeks  PLANNED INTERVENTIONS: 97164- PT Re-evaluation, 97110-Therapeutic exercises, 97530- Therapeutic activity, [id number]- Neuromuscular re-education, 97535- Self Care, [id number]- Manual therapy, [id number]- Electrical stimulation (unattended), [id number]- Electrical stimulation (manual), Dry Needling, Cryotherapy, and Moist heat  PLAN FOR NEXT SESSION: assess HEP response, DNF and periscapular  strengthening, manual for neck and R UE. DNF/ Pec stretching, lower trap activation/ serratus ant/ rows.  Look about adding iontophoresis to POC , renew next session and trial foam roll towel routine.   Ivor Mars PT  05/13/24 10:12 AM

## 2024-05-18 ENCOUNTER — Ambulatory Visit: Admitting: Physical Therapy

## 2024-05-18 DIAGNOSIS — M542 Cervicalgia: Secondary | ICD-10-CM

## 2024-05-18 DIAGNOSIS — M6281 Muscle weakness (generalized): Secondary | ICD-10-CM

## 2024-05-18 DIAGNOSIS — R293 Abnormal posture: Secondary | ICD-10-CM

## 2024-05-18 NOTE — Therapy (Signed)
 OUTPATIENT PHYSICAL THERAPY TREATMENT   Patient Name: Eric Nunez MRN: [id number] DOB:1971/05/10, 53 y.o., male Today's Date: 05/18/2024  END OF SESSION:  PT End of Session - 05/13/24 0758     Visit Number 8    Number of Visits 17    Date for PT Re-Evaluation 06/15/24    Authorization Type [employer]    Authorization Time Period 3/24 -  05/13/24    Authorization - Visit Number 6    Authorization - Number of Visits 10    PT Start Time 0800    PT Stop Time 0844    PT Time Calculation (min) 44 min    Activity Tolerance Patient tolerated treatment well    Behavior During Therapy [facility] for tasks assessed/performed                  Past Medical History:  Diagnosis Date   Deafness    right ear   Elevated cholesterol 03/2020   Elevated TSH 03/2020   Right shoulder pain 03/2020   Past Surgical History:  Procedure Laterality Date   APPENDECTOMY     left knee repair     Patient Active Problem List   Diagnosis Date Noted   Right shoulder pain 04/16/2020   Thumb pain, right 04/16/2020   Encounter for follow-up 04/16/2020    PCP: Collins Dean, NP  REFERRING PROVIDER: Sandie Cross, PA-C  REFERRING DIAG:  M54.2 (ICD-10-CM) - Neck pain M25.511,G89.29 (ICD-10-CM) - Chronic right shoulder pain  THERAPY DIAG:  Cervicalgia  Muscle weakness (generalized)  Abnormal posture  Rationale for Evaluation and Treatment: Rehabilitation  ONSET DATE: Chronic  SUBJECTIVE:                                                                                                                                                                                                         SUBJECTIVE STATEMENT: 05/18/2024 " still feeling sore after the last session."   EVAL: Pt presents to PT with reports of chronic R sided neck pain that refers into R UE. Notes N/T in R forearm and R hand, denies bowel/bladder changes or saddle anesthesia. Pt was recently laid off from his  job, before this he had difficulty with lifting 25# and repetitive motions that exacerbated pain. He has had therapy in the past but states that they mainly focused on his shoulder and his neck has felt like the primary issue.   Hand dominance: Right  PERTINENT HISTORY:  None  PAIN:  Are you having pain?  Yes: NPRS scale: 1-2/10 Pain location:  R upper trap, R cervical paraspinals, R forearm Pain description: tight, achy, N/T Aggravating factors: lifting, turning head, prolonged sitting Relieving factors: none  PRECAUTIONS: None  RED FLAGS: None     WEIGHT BEARING RESTRICTIONS: No  FALLS:  Has patient fallen in last 6 months? Yes. Number of falls - 2 while a   LIVING ENVIRONMENT: Lives with: lives alone Lives in: House/apartment  OCCUPATION: Not currently working  PLOF: Independent  PATIENT GOALS: decrease neck pain, be able to lift 25lbs objects without discomfort  OBJECTIVE:  Note: Objective measures were completed at Evaluation unless otherwise noted.  DIAGNOSTIC FINDINGS:  See imaging   PATIENT SURVEYS:  NDI: 22/50  COGNITION: Overall cognitive status: Within functional limits for tasks assessed  SENSATION: Light touch: Impaired - R dorsum of hand  POSTURE: rounded shoulders and forward head  PALPATION: TTP to R upper trap, R levator, R wrist extensors   CERVICAL ROM:   Active ROM A/PROM (deg) eval  Flexion   Extension   Right lateral flexion   Left lateral flexion   Right rotation 40  Left rotation 60   (Blank rows = not tested)  UPPER EXTREMITY ROM:  Active ROM Right eval Left eval  Shoulder flexion [facility]  Shoulder extension    Shoulder abduction [facility]  Shoulder adduction    Shoulder extension    Shoulder internal rotation    Shoulder external rotation    Elbow flexion    Elbow extension    Wrist flexion    Wrist extension    Wrist ulnar deviation    Wrist radial deviation    Wrist pronation    Wrist supination      (Blank rows = not tested)  UPPER EXTREMITY MMT:  MMT Right eval Left eval  Shoulder flexion [facility]  Shoulder extension    Shoulder abduction [facility]  Shoulder adduction    Shoulder extension    Shoulder internal rotation    Shoulder external rotation    Middle trapezius 3+/5 3+/5  Lower trapezius 3+/5 3+/5  Elbow flexion    Elbow extension    Wrist flexion    Wrist extension    Wrist ulnar deviation    Wrist radial deviation    Wrist pronation    Wrist supination    Grip strength 90lbs 105lbs   (Blank rows = not tested)  CERVICAL SPECIAL TESTS:  Upper limb tension test (ULTT): Negative  FUNCTIONAL TESTS:  Deep Cervical Flexor Endurance: 12 sec  TREATMENT: [facility] Adult PT Treatment:                                                DATE: 05/18/24 MTPR along the R levator scapulae Reviewed HEp/ rip mobs and rhomboid stretch UBE L5 x 5 min ( FWD/BWD 2:30) Rows narrow/ wide grip 1 x 10 each 35# Lat pulldown 2 x 10 with 35# Standing against wall doing cirlces on pilates ring  Pilates ring bow and arrow 1 x 5 ea  [facility] Adult PT Treatment:                                                DATE: 05/13/24 Supine chin tuck x 10 - 3" hold Supine  shoulder flexion 2x10 2# bilateral Inclined shoulder flexion 2x10 2# bilateral Supine horizontal abd 2x15 RTB Supine pec stretch with rolled up towel and scapular retraction Standing ER with chin tuck at wall 2x10 RTB Prone I/T 2x10 each Seated bilateral ER on wedge 2x10 RTB Manual Therapy: STM to cervical paraspinals Suboccipital release TPR to L levator  [facility] Adult PT Treatment:                                                DATE: 04/27/24 Lower trap activation/ review with Red theraband Supine pec stretch with rolled up towel and scapular retraction Rhomboid stretch 2 x 30 at sink MTPR along the sub-clavius Distal clavicle mobs with strap First rib mobs with strap Lower trap strengthening with bil shoulder flexion 1x10 Updated  HEP   PATIENT EDUCATION:  Education details: continue HEP Person educated: Patient Education method: Explanation, Demonstration, and Handouts Education comprehension: verbalized understanding and returned demonstration  HOME EXERCISE PROGRAM: Access Code: [email or web address] URL: [email or web address] Date: 04/27/2024 Prepared by: Laron Plummer  Program Notes to work on strengthening of the lower trap, you can do this seated with your elbows resting on a table or physioball with palms facing you and pulling the hands apart.  2 - 3 sets 10 -12 reps with red theraband.   Exercises - Seated Wrist Flexion Stretch  - 1 x daily - 7 x weekly - 3 reps - 30 sec hold - Seated Upper Trapezius Stretch  - 1 x daily - 7 x weekly - 3 reps - 30 sec hold - Seated Scapular Retraction  - 1 x daily - 7 x weekly - 2 sets - 10 reps - 3 sec hold - Standing Shoulder Row with Anchored Resistance  - 1 x daily - 7 x weekly - 3 sets - 10 reps - black band hold - Supine Chin Tuck  - 1 x daily - 7 x weekly - 2 sets - 10 reps - 5 sec hold - Cervical SNAG Rotation (Mirrored)  - 1 x daily - 7 x weekly - 2 sets - 10 reps - 3- 5 sec hold - Seated or Standing Cervical Retraction  - 1 x daily - 7 x weekly - 2 sets - 10 reps - 5- 10 sec hold - Gentle Levator Scapulae Stretch (Mirrored)  - 1 x daily - 7 x weekly - 2 sets - 2 reps - 30 seconds hold - 3 Finger Cervical Rotation  - 1 x daily - 7 x weekly - 2 sets - 10 reps - Single Arm Doorway Pec Stretch at 90 Degrees Abduction  - 1 x daily - 7 x weekly - 2 sets - 2 reps - 30 sec  hold - Scapular retraction with ER (MONEY)  - 1 x daily - 7 x weekly - 3 sets - 10 reps - Supine Shoulder Press AAROM in Abduction with Dowel  - 1 x daily - 7 x weekly - 3 sets - 10 reps - Supine Chest Stretch on Foam Roll  - 1 x daily - 7 x weekly - 2 sets - 2-3 reps - 30 sec  hold - Seated Rhomboid Stretch  - 1 x daily - 7 x weekly - 2 sets - 2-3 reps - 30 sec hold - First Rib  Mobilization with Strap (Mirrored)  - 1 x daily - 2 sets -  10 reps - 5 hold  ASSESSMENT:  CLINICAL IMPRESSION: 05/18/2024 Mr Arthur Billing arrives to PT today noting continued improvement in the neck with his only point of soreness/ tightness being in the levator scapulae. Continued MTPR focused on the levator scapulae and reviewed HEP per pt's request. Continued focus on strengthening using gym equipment for the posterior chain which he did well with. End of session he noted no increase of pain / tightness.    EVAL: Patient is a 53 y.o. M who was seen today for physical therapy evaluation and treatment for chronic neck and UE pain. Physical findings are consistent with referring provider impression as pt demonstrates decrease in cervical ROM and UE function. NDI score severe disability in performance of home ADLs and community activities. Pt would benefit from skilled PT services working on improving cervical ROM, DNF endurance, and postural muscle strength in order to decrease pain and improve function.   OBJECTIVE IMPAIRMENTS: decreased activity tolerance, decreased mobility, difficulty walking, decreased ROM, decreased strength, impaired UE functional use, postural dysfunction, and pain.   ACTIVITY LIMITATIONS: carrying, lifting, sitting, and reach over head  PARTICIPATION LIMITATIONS: driving, shopping, community activity, occupation, and yard work  PERSONAL FACTORS: Time since onset of injury/illness/exacerbation are also affecting patient's functional outcome.   REHAB POTENTIAL: Excellent  CLINICAL DECISION MAKING: Stable/uncomplicated  EVALUATION COMPLEXITY: Low   GOALS: Goals reviewed with patient? No  SHORT TERM GOALS: Target date: 04/01/2024   Pt will be compliant and knowledgeable with initial HEP for improved comfort and carryover Baseline: initial HEP given  Goal status: MET  2.  Pt will self report neck and R UE pain no greater than 6/10 for improved comfort and functional  ability Baseline: 10/10 at worst Goal status: Ongoing  LONG TERM GOALS: Target date: 06/15/2024   Pt will decrease NDI disability score to no greater than 11/50 as proxy for functional improvement Baseline: 22/50 Goal status: INITIAL   2.  Pt will self report neck and R UE pain no greater than 3/10 for improved comfort and functional ability Baseline: 10/10 at worst Goal status: INITIAL   3.  Pt will improve R cervical rotation AROM to no less than 60 degrees for improved functional ability and comfort with home ADLs and driving  Baseline: 40 seconds Goal status: INITIAL  4.  Pt will improve bilateral middle/lower trap strength to at least 4/5 for improved postural strength and decreased neck pain Baseline: 3+/5 Goal status: INITIAL  5.  Pt will improve R grip strength to at least 105lbs for improved functional ability with grasping tasks Baseline: 90lbs Goal status: INITIAL   PLAN:  PT FREQUENCY: 2x/week  PT DURATION: 8 weeks  PLANNED INTERVENTIONS: 97164- PT Re-evaluation, 97110-Therapeutic exercises, 97530- Therapeutic activity, [id number]- Neuromuscular re-education, 97535- Self Care, 16109- Manual therapy, [id number]- Electrical stimulation (unattended), [id number]- Electrical stimulation (manual), Dry Needling, Cryotherapy, and Moist heat  PLAN FOR NEXT SESSION: assess HEP response, DNF and periscapular strengthening, manual for neck and R UE. DNF/ Pec stretching, lower trap activation/ serratus ant/ rows.  Look about adding iontophoresis to POC , renew next session and trial foam roll towel routine.   Rally Ouch PT, DPT, LAT, ATC  05/18/24  11:07 AM

## 2024-05-18 NOTE — Addendum Note (Signed)
 Addended by: Ivor Mars on: 05/18/2024 08:19 AM   Modules accepted: Orders

## 2024-05-25 ENCOUNTER — Encounter: Payer: Self-pay | Admitting: Physical Therapy

## 2024-05-25 ENCOUNTER — Ambulatory Visit: Attending: Physician Assistant | Admitting: Physical Therapy

## 2024-05-25 DIAGNOSIS — M542 Cervicalgia: Secondary | ICD-10-CM | POA: Diagnosis present

## 2024-05-25 DIAGNOSIS — M6281 Muscle weakness (generalized): Secondary | ICD-10-CM | POA: Diagnosis present

## 2024-05-25 DIAGNOSIS — R293 Abnormal posture: Secondary | ICD-10-CM | POA: Insufficient documentation

## 2024-05-25 NOTE — Therapy (Signed)
 OUTPATIENT PHYSICAL THERAPY TREATMENT   Patient Name: Jayanth Szczesniak MRN: [id number] DOB:16-May-1971, 53 y.o., male Today's Date: 05/25/2024  END OF SESSION:  PT End of Session - 05/13/24 0758     Visit Number 8    Number of Visits 17    Date for PT Re-Evaluation 06/15/24    Authorization Type [facility]    Authorization Time Period 3/24 -  05/13/24    Authorization - Visit Number 6    Authorization - Number of Visits 10    PT Start Time 0800    PT Stop Time 0844    PT Time Calculation (min) 44 min    Activity Tolerance Patient tolerated treatment well    Behavior During Therapy [facility] for tasks assessed/performed                  Past Medical History:  Diagnosis Date   Deafness    right ear   Elevated cholesterol 03/2020   Elevated TSH 03/2020   Right shoulder pain 03/2020   Past Surgical History:  Procedure Laterality Date   APPENDECTOMY     left knee repair     Patient Active Problem List   Diagnosis Date Noted   Right shoulder pain 04/16/2020   Thumb pain, right 04/16/2020   Encounter for follow-up 04/16/2020    PCP: Collins Dean, NP  REFERRING PROVIDER: Sandie Cross, PA-C  REFERRING DIAG:  M54.2 (ICD-10-CM) - Neck pain M25.511,G89.29 (ICD-10-CM) - Chronic right shoulder pain  THERAPY DIAG:  Cervicalgia  Muscle weakness (generalized)  Abnormal posture  Rationale for Evaluation and Treatment: Rehabilitation  ONSET DATE: Chronic  SUBJECTIVE:                                                                                                                                                                                                         SUBJECTIVE STATEMENT: 05/25/2024 "I am doing alright today. I walked yesterday and ended up walking for an hour which occurred after walking on the tredmill for an hour earlier. I did notice my neck was stiff with walking and my neck was sore."   EVAL: Pt presents to PT with reports of  chronic R sided neck pain that refers into R UE. Notes N/T in R forearm and R hand, denies bowel/bladder changes or saddle anesthesia. Pt was recently laid off from his job, before this he had difficulty with lifting 25# and repetitive motions that exacerbated pain. He has had therapy in the past but states that they mainly focused on his shoulder and  his neck has felt like the primary issue.   Hand dominance: Right  PERTINENT HISTORY:  None  PAIN:  Are you having pain?  Yes: NPRS scale: 1-2/10 Pain location: R upper trap, R cervical paraspinals, R forearm Pain description: tight, achy, N/T Aggravating factors: lifting, turning head, prolonged sitting Relieving factors: none  PRECAUTIONS: None  RED FLAGS: None     WEIGHT BEARING RESTRICTIONS: No  FALLS:  Has patient fallen in last 6 months? Yes. Number of falls - 2 while a   LIVING ENVIRONMENT: Lives with: lives alone Lives in: House/apartment  OCCUPATION: Not currently working  PLOF: Independent  PATIENT GOALS: decrease neck pain, be able to lift 25lbs objects without discomfort  OBJECTIVE:  Note: Objective measures were completed at Evaluation unless otherwise noted.  DIAGNOSTIC FINDINGS:  See imaging   PATIENT SURVEYS:  NDI: 22/50  05/25/24: 16/50  COGNITION: Overall cognitive status: Within functional limits for tasks assessed  SENSATION: Light touch: Impaired - R dorsum of hand  POSTURE: rounded shoulders and forward head  PALPATION: TTP to R upper trap, R levator, R wrist extensors   CERVICAL ROM:   Active ROM A/PROM (deg) eval 05/25/24  Flexion    Extension    Right lateral flexion    Left lateral flexion    Right rotation 40 55  Left rotation 60    (Blank rows = not tested)  UPPER EXTREMITY ROM:  Active ROM Right eval Left eval  Shoulder flexion [facility]  Shoulder extension    Shoulder abduction [facility]  Shoulder adduction    Shoulder extension    Shoulder internal rotation     Shoulder external rotation    Elbow flexion    Elbow extension    Wrist flexion    Wrist extension    Wrist ulnar deviation    Wrist radial deviation    Wrist pronation    Wrist supination     (Blank rows = not tested)  UPPER EXTREMITY MMT:  MMT Right eval Left eval Right 05/25/24  Shoulder flexion [facility]   Shoulder extension     Shoulder abduction [facility]   Shoulder adduction     Shoulder extension     Shoulder internal rotation     Shoulder external rotation     Middle trapezius 3+/5 3+/5 4-/5  Lower trapezius 3+/5 3+/5 4-/5  Elbow flexion     Elbow extension     Wrist flexion     Wrist extension     Wrist ulnar deviation     Wrist radial deviation     Wrist pronation     Wrist supination     Grip strength 90lbs 105lbs 75, 82, 111 AVG 89.3   (Blank rows = not tested)  CERVICAL SPECIAL TESTS:  Upper limb tension test (ULTT): Negative  FUNCTIONAL TESTS:  Deep Cervical Flexor Endurance: 12 sec  TREATMENT: [facility] Adult PT Treatment:                                                DATE: 05/25/24 Seated thoracic roation hugging ball, thoracic extension hugging ball 2 x 10 ea ( second set without ball) Reviewed goals, HEP Prone shoulder Y's and T's Reviewed goal and functional progression Reviewed HEP given previously and updated today   [facility] Adult PT Treatment:  DATE: 05/18/24 MTPR along the R levator scapulae Reviewed HEp/ rip mobs and rhomboid stretch UBE L5 x 5 min ( FWD/BWD 2:30) Rows narrow/ wide grip 1 x 10 each 35# Lat pulldown 2 x 10 with 35# Standing against wall doing cirlces on pilates ring  Pilates ring bow and arrow 1 x 5 ea  [facility] Adult PT Treatment:                                                DATE: 05/13/24 Supine chin tuck x 10 - 3" hold Supine shoulder flexion 2x10 2# bilateral Inclined shoulder flexion 2x10 2# bilateral Supine horizontal abd 2x15 RTB Supine pec stretch with rolled up towel and  scapular retraction Standing ER with chin tuck at wall 2x10 RTB Prone I/T 2x10 each Seated bilateral ER on wedge 2x10 RTB Manual Therapy: STM to cervical paraspinals Suboccipital release TPR to L levator  PATIENT EDUCATION:  Education details: continue HEP Person educated: Patient Education method: Explanation, Demonstration, and Handouts Education comprehension: verbalized understanding and returned demonstration  HOME EXERCISE PROGRAM: Access Code: [email or web address] URL: [email or web address] Date: 05/25/2024 Prepared by: Laron Plummer  Program Notes to work on strengthening of the lower trap, you can do this seated with your elbows resting on a table or physioball with palms facing you and pulling the hands apart.  2 - 3 sets 10 -12 reps with red theraband.   Exercises - Seated Wrist Flexion Stretch  - 1 x daily - 7 x weekly - 3 reps - 30 sec hold - Seated Upper Trapezius Stretch  - 1 x daily - 7 x weekly - 3 reps - 30 sec hold - Seated Scapular Retraction  - 1 x daily - 7 x weekly - 2 sets - 10 reps - 3 sec hold - Standing Shoulder Row with Anchored Resistance  - 1 x daily - 7 x weekly - 3 sets - 10 reps - black band hold - Supine Chin Tuck  - 1 x daily - 7 x weekly - 2 sets - 10 reps - 5 sec hold - Cervical SNAG Rotation (Mirrored)  - 1 x daily - 7 x weekly - 2 sets - 10 reps - 3- 5 sec hold - Seated or Standing Cervical Retraction  - 1 x daily - 7 x weekly - 2 sets - 10 reps - 5- 10 sec hold - Gentle Levator Scapulae Stretch (Mirrored)  - 1 x daily - 7 x weekly - 2 sets - 2 reps - 30 seconds hold - 3 Finger Cervical Rotation  - 1 x daily - 7 x weekly - 2 sets - 10 reps - Single Arm Doorway Pec Stretch at 90 Degrees Abduction  - 1 x daily - 7 x weekly - 2 sets - 2 reps - 30 sec  hold - Scapular retraction with ER (MONEY)  - 1 x daily - 7 x weekly - 3 sets - 10 reps - Supine Shoulder Press AAROM in Abduction with Dowel  - 1 x daily - 7 x weekly - 3 sets - 10 reps -  Supine Chest Stretch on Foam Roll  - 1 x daily - 7 x weekly - 2 sets - 2-3 reps - 30 sec  hold - Seated Rhomboid Stretch  - 1 x daily - 7 x weekly - 2 sets - 2-3 reps - 30  sec hold - First Rib Mobilization with Strap (Mirrored)  - 1 x daily - 2 sets - 10 reps - 5 hold - Seated Thoracic Rotation  - 1 x daily - 7 x weekly - 2 sets - 10 reps - 2 hold - Seated Thoracic Lumbar Extension with Pectoralis Stretch  - 1 x daily - 7 x weekly - 2 sets - 10 reps - Shoulder Y's  - 1 x daily - 7 x weekly - 2 sets - 10 reps - Prone Shoulder Horizontal Abduction  - 1 x daily - 7 x weekly - 2 sets - 10 re  ASSESSMENT:  CLINICAL IMPRESSION: 05/25/2024 Kayen continues to make good progress with physical therapy. He has met his short term goals and is making progress with his Long term goals appropriately. He notes being consistent with his HEP does help reduce the max pain he has been getting. Todays session focused on reassessment and thoracic mobility and peri-scapular stability which he did well noting some challenges in the prone position with pillow under the hips. Based on patients functional progress and reaching his goals, NDI did show a 6 point regression compared to his assessment at eval but that doesn't meet the MCID threshold to be clinically significant. Marice would benefit from continued physical therapy to reduce neck pain/ stiffness, reduce referred symptoms, improve strength, and maximize his overall function.    EVAL: Patient is a 53 y.o. M who was seen today for physical therapy evaluation and treatment for chronic neck and UE pain. Physical findings are consistent with referring provider impression as pt demonstrates decrease in cervical ROM and UE function. NDI score severe disability in performance of home ADLs and community activities. Pt would benefit from skilled PT services working on improving cervical ROM, DNF endurance, and postural muscle strength in order to decrease pain and improve  function.   OBJECTIVE IMPAIRMENTS: decreased activity tolerance, decreased mobility, difficulty walking, decreased ROM, decreased strength, impaired UE functional use, postural dysfunction, and pain.   ACTIVITY LIMITATIONS: carrying, lifting, sitting, and reach over head  PARTICIPATION LIMITATIONS: driving, shopping, community activity, occupation, and yard work  PERSONAL FACTORS: Time since onset of injury/illness/exacerbation are also affecting patient's functional outcome.   REHAB POTENTIAL: Excellent  CLINICAL DECISION MAKING: Stable/uncomplicated  EVALUATION COMPLEXITY: Low   GOALS: Goals reviewed with patient? No  SHORT TERM GOALS: Target date: 04/01/2024   Pt will be compliant and knowledgeable with initial HEP for improved comfort and carryover Baseline: initial HEP given  Goal status: MET  2.  Pt will self report neck and R UE pain no greater than 6/10 for improved comfort and functional ability Baseline: 10/10 at worst    Status: 5-6/10 Goal status: MET 05/25/24  LONG TERM GOALS: Target date: 06/15/2024   Pt will decrease NDI disability score to no greater than 11/50 as proxy for functional improvement Baseline: 22/50 Status: 16/50 Goal status: Progressing 05/25/24   2.  Pt will self report neck and R UE pain no greater than 3/10 for improved comfort and functional ability Baseline: 10/10 at worst Status: 5-6/10 at worst Goal status: on going 05/25/24   3.  Pt will improve R cervical rotation AROM to no less than 60 degrees for improved functional ability and comfort with home ADLs and driving  Baseline: 40 deg Status: 55 degres Goal status: Progressing 05/25/24  4.  Pt will improve bilateral middle/lower trap strength to at least 4/5 for improved postural strength and decreased neck pain Baseline: 3+/5 Status:  4-/5  Goal status: ongoing 05/25/24  5.  Pt will improve R grip strength to at least 105lbs for improved functional ability with grasping tasks Baseline:  90lbs Status: 89.3# noted some R hand pain which limited grip today.  Goal status: ongoing 05/25/24   PLAN:  PT FREQUENCY: 2x/week  PT DURATION: 8 weeks  PLANNED INTERVENTIONS: 97164- PT Re-evaluation, 97110-Therapeutic exercises, 97530- Therapeutic activity, 97112- Neuromuscular re-education, 97535- Self Care, 16109- Manual therapy, [id number]- Electrical stimulation (unattended), [phone]- Electrical stimulation (manual), Dry Needling, Cryotherapy, and Moist heat  PLAN FOR NEXT SESSION: assess HEP response, DNF and periscapular strengthening, manual for neck and R UE. DNF/ Pec stretching, lower trap activation/ serratus ant/ rows.  Look about adding iontophoresis to POC , trial foam roll towel routine.   Elka Satterfield PT, DPT, LAT, ATC  05/25/24  11:26 AM

## 2024-06-01 ENCOUNTER — Ambulatory Visit

## 2024-06-01 DIAGNOSIS — M542 Cervicalgia: Secondary | ICD-10-CM | POA: Diagnosis not present

## 2024-06-01 DIAGNOSIS — M6281 Muscle weakness (generalized): Secondary | ICD-10-CM

## 2024-06-01 DIAGNOSIS — R293 Abnormal posture: Secondary | ICD-10-CM

## 2024-06-01 NOTE — Therapy (Signed)
 OUTPATIENT PHYSICAL THERAPY TREATMENT/DISCHARGE  PHYSICAL THERAPY DISCHARGE SUMMARY  Visits from Start of Care: 11  Current functional level related to goals / functional outcomes: See goals and objective   Remaining deficits: See goals and objective   Education / Equipment: HEP   Patient agrees to discharge. Patient goals were partially met. Patient is being discharged due to being pleased with the current functional level.   Patient Name: Eric Nunez MRN: [id number] DOB:April 17, 1971, 53 y.o., male Today's Date: 06/02/2024   PT End of Session - 06/01/24 0759     Visit Number 11    Number of Visits 17    Date for PT Re-Evaluation 06/15/24    Authorization Type [facility]    Authorization Time Period 3/24 -  05/13/24    Authorization - Visit Number 8    Authorization - Number of Visits 10    PT Start Time 0800    PT Stop Time 0838    PT Time Calculation (min) 38 min    Activity Tolerance Patient tolerated treatment well    Behavior During Therapy [facility] for tasks assessed/performed               Past Medical History:  Diagnosis Date   Deafness    right ear   Elevated cholesterol 03/2020   Elevated TSH 03/2020   Right shoulder pain 03/2020   Past Surgical History:  Procedure Laterality Date   APPENDECTOMY     left knee repair     Patient Active Problem List   Diagnosis Date Noted   Right shoulder pain 04/16/2020   Thumb pain, right 04/16/2020   Encounter for follow-up 04/16/2020    PCP: Collins Dean, NP  REFERRING PROVIDER: Sandie Cross, PA-C  REFERRING DIAG:  M54.2 (ICD-10-CM) - Neck pain M25.511,G89.29 (ICD-10-CM) - Chronic right shoulder pain  THERAPY DIAG:  Cervicalgia  Muscle weakness (generalized)  Abnormal posture  Rationale for Evaluation and Treatment: Rehabilitation  ONSET DATE: Chronic  SUBJECTIVE:                                                                                                                                                                                                          SUBJECTIVE STATEMENT: Pt presents to PT noting he is feeling okay currently, has been compliant with HEP. We discussed that [facility] has denied his extension, today will be his last session. He understands and is ready to discharge from therapy.    EVAL: Pt presents to PT with reports of chronic R sided neck pain that refers  into R UE. Notes N/T in R forearm and R hand, denies bowel/bladder changes or saddle anesthesia. Pt was recently laid off from his job, before this he had difficulty with lifting 25# and repetitive motions that exacerbated pain. He has had therapy in the past but states that they mainly focused on his shoulder and his neck has felt like the primary issue.   Hand dominance: Right  PERTINENT HISTORY:  None  PAIN:  Are you having pain?  Yes: NPRS scale: 1-2/10 Pain location: R upper trap, R cervical paraspinals, R forearm Pain description: tight, achy, N/T Aggravating factors: lifting, turning head, prolonged sitting Relieving factors: none  PRECAUTIONS: None  RED FLAGS: None     WEIGHT BEARING RESTRICTIONS: No  FALLS:  Has patient fallen in last 6 months? Yes. Number of falls - 2 while a   LIVING ENVIRONMENT: Lives with: lives alone Lives in: House/apartment  OCCUPATION: Not currently working  PLOF: Independent  PATIENT GOALS: decrease neck pain, be able to lift 25lbs objects without discomfort  OBJECTIVE:  Note: Objective measures were completed at Evaluation unless otherwise noted.  DIAGNOSTIC FINDINGS:  See imaging   PATIENT SURVEYS:  NDI: 22/50  05/25/24: 16/50  COGNITION: Overall cognitive status: Within functional limits for tasks assessed  SENSATION: Light touch: Impaired - R dorsum of hand  POSTURE: rounded shoulders and forward head  PALPATION: TTP to R upper trap, R levator, R wrist extensors   CERVICAL ROM:   Active ROM A/PROM (deg) eval  05/25/24  Flexion    Extension    Right lateral flexion    Left lateral flexion    Right rotation 40 55  Left rotation 60    (Blank rows = not tested)  UPPER EXTREMITY ROM:  Active ROM Right eval Left eval  Shoulder flexion [facility]  Shoulder extension    Shoulder abduction [facility]  Shoulder adduction    Shoulder extension    Shoulder internal rotation    Shoulder external rotation    Elbow flexion    Elbow extension    Wrist flexion    Wrist extension    Wrist ulnar deviation    Wrist radial deviation    Wrist pronation    Wrist supination     (Blank rows = not tested)  UPPER EXTREMITY MMT:  MMT Right eval Left eval Right 05/25/24  Shoulder flexion [facility]   Shoulder extension     Shoulder abduction [facility]   Shoulder adduction     Shoulder extension     Shoulder internal rotation     Shoulder external rotation     Middle trapezius 3+/5 3+/5 4-/5  Lower trapezius 3+/5 3+/5 4-/5  Elbow flexion     Elbow extension     Wrist flexion     Wrist extension     Wrist ulnar deviation     Wrist radial deviation     Wrist pronation     Wrist supination     Grip strength 90lbs 105lbs 75, 82, 111 AVG 89.3   (Blank rows = not tested)  CERVICAL SPECIAL TESTS:  Upper limb tension test (ULTT): Negative  FUNCTIONAL TESTS:  Deep Cervical Flexor Endurance: 12 sec  TREATMENT: [facility] Adult PT Treatment:  DATE: 06/01/24 Prone Y/T 2x10 ea Standing row 2x15 black band Standing ER 2x15 RTB Seated thoracic ext 2x15  Standing corner stretch x 30 each Supine chin tuck x 10 - 3 hold Review of tests/measures, goals, and outcomes along with HEP review for discharge  [facility] Adult PT Treatment:                                                DATE: 05/25/24 Seated thoracic roation hugging ball, thoracic extension hugging ball 2 x 10 ea ( second set without ball) Reviewed goals, HEP Prone shoulder Y's and T's Reviewed goal and functional  progression Reviewed HEP given previously and updated today   [facility] Adult PT Treatment:                                                DATE: 05/18/24 MTPR along the R levator scapulae Reviewed HEp/ rip mobs and rhomboid stretch UBE L5 x 5 min ( FWD/BWD 2:30) Rows narrow/ wide grip 1 x 10 each 35# Lat pulldown 2 x 10 with 35# Standing against wall doing cirlces on pilates ring  Pilates ring bow and arrow 1 x 5 ea  PATIENT EDUCATION:  Education details: continue HEP Person educated: Patient Education method: Explanation, Demonstration, and Handouts Education comprehension: verbalized understanding and returned demonstration  HOME EXERCISE PROGRAM: Access Code: [email or web address] URL: [email or web address] Date: 06/01/2024 Prepared by: Loral Roch  Program Notes to work on strengthening of the lower trap, you can do this seated with your elbows resting on a table or physioball with palms facing you and pulling the hands apart.  2 - 3 sets 10 -12 reps with red theraband.   Exercises - Seated Wrist Flexion Stretch  - 1 x daily - 7 x weekly - 3 reps - 30 sec hold - Seated Upper Trapezius Stretch  - 1 x daily - 7 x weekly - 3 reps - 30 sec hold - Seated Scapular Retraction  - 1 x daily - 7 x weekly - 2 sets - 10 reps - 3 sec hold - Standing Shoulder Row with Anchored Resistance  - 1 x daily - 7 x weekly - 3 sets - 15 reps - black band hold - Supine Chin Tuck  - 1 x daily - 7 x weekly - 2 sets - 10 reps - 5 sec hold - Cervical SNAG Rotation (Mirrored)  - 1 x daily - 7 x weekly - 2 sets - 10 reps - 3- 5 sec hold - Seated or Standing Cervical Retraction  - 1 x daily - 7 x weekly - 2 sets - 10 reps - 5- 10 sec hold - Gentle Levator Scapulae Stretch (Mirrored)  - 1 x daily - 7 x weekly - 2 sets - 2 reps - 30 seconds hold - 3 Finger Cervical Rotation  - 1 x daily - 7 x weekly - 2 sets - 10 reps - Single Arm Doorway Pec Stretch at 90 Degrees Abduction  - 1 x daily - 7 x weekly - 2 sets - 2  reps - 30 sec  hold - Scapular retraction with ER (MONEY)  - 1 x daily - 7 x weekly - 3 sets - 15 reps - Supine  Shoulder Press AAROM in Abduction with Dowel  - 1 x daily - 7 x weekly - 3 sets - 10 reps - Supine Chest Stretch on Foam Roll  - 1 x daily - 7 x weekly - 2 sets - 2-3 reps - 30 sec  hold - Seated Rhomboid Stretch  - 1 x daily - 7 x weekly - 2 sets - 2-3 reps - 30 sec hold - First Rib Mobilization with Strap (Mirrored)  - 1 x daily - 2 sets - 10 reps - 5 hold - Seated Thoracic Rotation  - 1 x daily - 7 x weekly - 2 sets - 10 reps - 2 hold - Seated Thoracic Lumbar Extension with Pectoralis Stretch  - 1 x daily - 7 x weekly - 2 sets - 10 reps - Shoulder Y's  - 1 x daily - 7 x weekly - 2 sets - 10 reps - Prone Shoulder Horizontal Abduction  - 1 x daily - 7 x weekly - 2 sets - 10 reps  ASSESSMENT:  CLINICAL IMPRESSION: Pt was able to complete prescribed exercises and demonstrated knowledge of HEP with no adverse effect. Over the course of PT treatment he has progressed fairly well over the last few weeks close to meeting all LTGs and noting decreased pain. Unfortunately he was denied extension by insurance for continued visits. He is in agreement with discharge from skilled therapy at this time.   EVAL: Patient is a 53 y.o. M who was seen today for physical therapy evaluation and treatment for chronic neck and UE pain. Physical findings are consistent with referring provider impression as pt demonstrates decrease in cervical ROM and UE function. NDI score severe disability in performance of home ADLs and community activities. Pt would benefit from skilled PT services working on improving cervical ROM, DNF endurance, and postural muscle strength in order to decrease pain and improve function.   OBJECTIVE IMPAIRMENTS: decreased activity tolerance, decreased mobility, difficulty walking, decreased ROM, decreased strength, impaired UE functional use, postural dysfunction, and pain.   ACTIVITY  LIMITATIONS: carrying, lifting, sitting, and reach over head  PARTICIPATION LIMITATIONS: driving, shopping, community activity, occupation, and yard work  PERSONAL FACTORS: Time since onset of injury/illness/exacerbation are also affecting patient's functional outcome.   REHAB POTENTIAL: Excellent  CLINICAL DECISION MAKING: Stable/uncomplicated  EVALUATION COMPLEXITY: Low   GOALS: Goals reviewed with patient? No  SHORT TERM GOALS: Target date: 04/01/2024   Pt will be compliant and knowledgeable with initial HEP for improved comfort and carryover Baseline: initial HEP given  Goal status: MET  2.  Pt will self report neck and R UE pain no greater than 6/10 for improved comfort and functional ability Baseline: 10/10 at worst    Status: 5-6/10 Goal status: MET 05/25/24  LONG TERM GOALS: Target date: 06/15/2024   Pt will decrease NDI disability score to no greater than 11/50 as proxy for functional improvement Baseline: 22/50 Status: 16/50 Goal status: Progressing 05/25/24   2.  Pt will self report neck and R UE pain no greater than 3/10 for improved comfort and functional ability Baseline: 10/10 at worst Status: 5-6/10 at worst Goal status: on going 05/25/24   3.  Pt will improve R cervical rotation AROM to no less than 60 degrees for improved functional ability and comfort with home ADLs and driving  Baseline: 40 deg Status: 55 degres Goal status: Progressing 05/25/24  4.  Pt will improve bilateral middle/lower trap strength to at least 4/5 for improved postural strength and  decreased neck pain Baseline: 3+/5 Status: 4-/5  Goal status: ongoing 05/25/24  5.  Pt will improve R grip strength to at least 105lbs for improved functional ability with grasping tasks Baseline: 90lbs Status: 89.3# noted some R hand pain which limited grip today.  Goal status: ongoing 05/25/24   PLAN:  PT FREQUENCY: 2x/week  PT DURATION: 8 weeks  PLANNED INTERVENTIONS: 97164- PT Re-evaluation,  97110-Therapeutic exercises, 97530- Therapeutic activity, 97112- Neuromuscular re-education, 97535- Self Care, 16109- Manual therapy, [id number]- Electrical stimulation (unattended), [phone]- Electrical stimulation (manual), Dry Needling, Cryotherapy, and Moist heat  PLAN FOR NEXT SESSION: assess HEP response, DNF and periscapular strengthening, manual for neck and R UE. DNF/ Pec stretching, lower trap activation/ serratus ant/ rows.  Look about adding iontophoresis to POC , trial foam roll towel routine.   Ivor Mars PT  06/02/24 9:52 AM

## 2024-06-06 ENCOUNTER — Encounter

## 2024-06-14 ENCOUNTER — Encounter

## 2024-06-20 ENCOUNTER — Encounter

## 2024-07-21 ENCOUNTER — Encounter (INDEPENDENT_AMBULATORY_CARE_PROVIDER_SITE_OTHER): Payer: Self-pay | Admitting: Family Medicine

## 2024-07-21 ENCOUNTER — Ambulatory Visit (INDEPENDENT_AMBULATORY_CARE_PROVIDER_SITE_OTHER): Admitting: Family Medicine

## 2024-07-21 VITALS — BP 121/77 | HR 86 | Temp 99.1°F | Ht 67.5 in | Wt 228.0 lb

## 2024-07-21 DIAGNOSIS — E669 Obesity, unspecified: Secondary | ICD-10-CM

## 2024-07-21 DIAGNOSIS — Z6835 Body mass index (BMI) 35.0-35.9, adult: Secondary | ICD-10-CM

## 2024-07-21 DIAGNOSIS — E782 Mixed hyperlipidemia: Secondary | ICD-10-CM | POA: Diagnosis not present

## 2024-07-21 DIAGNOSIS — R7303 Prediabetes: Secondary | ICD-10-CM

## 2024-07-21 DIAGNOSIS — E038 Other specified hypothyroidism: Secondary | ICD-10-CM | POA: Diagnosis not present

## 2024-07-21 NOTE — Progress Notes (Signed)
 Eric DOROTHA Jenkins, DO, ABFM, ABOM Bariatric physician [address] [place], [place], [place] [address] Office: [phone]  /  Fax: [phone]     Initial Evaluation:  Eric Nunez was seen in clinic today to evaluate for obesity. He is interested in losing weight to improve overall health and reduce the risk of weight related complications. He presents today to review program treatment options, initial physical assessment, and evaluation.     He was referred by: PCP  When asked how has your weight affected you? He states: Contributed to medical problems, Having fatigue, and Having poor endurance  Contributing factors to his weight change: moderate to high levels of stress, reduced physical activity, nutritional, problems with eating patterns.   Some associated conditions: Arthritis, Hyperlipidemia, Prediabetes, and GERD.  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 His goal is to go to the gym 1-2 times/week.   Current or previous pharmacotherapy: None  Response to medication: Never tried medications   Past Medical History:  Diagnosis Date   Deafness    right ear   Elevated cholesterol 03/2020   Elevated TSH 03/2020   Right shoulder pain 03/2020    Current Outpatient Medications  Medication Instructions   HYDROcodone-acetaminophen  (NORCO/VICODIN) 5-325 MG tablet 1 tablet, Oral, Every 6 hours PRN   ibuprofen  (ADVIL ) 600 mg, Oral, Every 6 hours PRN   methocarbamol  (ROBAXIN -750) 750 mg, Oral, 2 times daily PRN   Multiple Vitamin (MULTIVITAMIN WITH MINERALS) TABS tablet 1 tablet, Daily   predniSONE  (STERAPRED UNI-PAK 21 TAB) 10 MG (21) TBPK tablet Take as directed     No Known Allergies   Past Surgical History:  Procedure Laterality Date   APPENDECTOMY     left knee repair       Family History  Problem Relation Age of Onset   Diabetes Mother    Diabetes Father       Objective:  BP 121/77   Pulse 86   Temp 99.1 F (37.3 C)   Ht 5' 7.5 (1.715 m)   Wt 228 lb (103.4 kg)   SpO2 97%   BMI 35.18 kg/m  He was weighed on the bioimpedance scale: Body mass index is 35.18 kg/m.  Visceral Fat rating : 18, Body Fat %: 32  Weight Lost Since Last Visit: N/a  Weight Gained Since Last Visit: N/a   Vitals Temp: 99.1 F (37.3 C) BP: 121/77 Pulse Rate: 86 SpO2: 97 %   Anthropometric Measurements Height: 5' 7.5 (1.715 m) Weight: 228 lb (103.4 kg) BMI (Calculated): 35.16 Weight at Last Visit: N/a Weight Lost Since Last Visit: N/a Weight Gained Since Last Visit: N/a Starting Weight: N/a Total Weight Loss (lbs): 0 lb (0 kg)   Body Composition  Body Fat %: 32 % Fat Mass (lbs): 73 lbs Muscle Mass (lbs): 147.4 lbs Total Body Water (lbs): 105.6 lbs Visceral Fat Rating : 18   Other Clinical Data Fasting: No Labs: No Today's Visit #: 0 Comments: Info session    General: Well Developed, well nourished, and in no acute distress.  HEENT: Normocephalic, atraumatic; EOMI, sclerae are anicteric. Skin: Warm and dry, good turgor Chest:  Normal excursion, shape, no gross ABN Respiratory: No conversational dyspnea; speaking in full sentences NeuroM-Sk:  Normal gross ROM * 4 extremities  Psych: A and O *3, insight adequate, mood- full    Assessment and Plan:    FOR THE DISEASE OF OBESITY:  BMI 35.0-35.9,adult  Obesity, Beginning BMI 35.2 Assessment & Plan: We reviewed anthropometrics, biometrics, associated medical conditions and contributing factors with patient. Eric Nunez would benefit from a medically tailored reduced calorie nutrional plan based on their REE (resting energy expenditure), which will be determined by indirect calorimetry.  We will also assess for cardiometabolic risk and nutritional derangements via fasting labs at intake appointment.    Obesity Treatment / Action Plan:   he was weighed on the bioimpedance scale and results  were discussed and documented in the synopsis.   Eric Nunez will complete provided nutritional and psychosocial assessment questionnaire before the next appointment.  he will be scheduled for indirect calorimetry to determine resting energy expenditure in a fasting state.  This will allow us  to create a reduced calorie, high-protein meal plan to promote loss of fat mass while preserving muscle mass.  We will also assess for cardiometabolic risk and nutritional derangements via an ECG and fasting serologies at his next appointment.  he was encouraged to work on amassing support from family and friends to begin their weight loss journey.   Work on eliminating or reducing the presence of highly processed, poorly nutritious, calorie-dense foods in the home.   Obesity Education Performed Today:  Patient was counseled on nutritional approaches to weight loss and benefits of reducing processed foods and consuming plant-based foods and high quality protein as part of nutritional weight management program.   We discussed the importance of long term lifestyle changes which include nutrition, exercise and behavioral modifications as well as the importance of customizing this to his specific health and social needs.   We discussed the benefits of reaching a healthier weight to alleviate the symptoms of existing conditions and reduce the risks of the biomechanical, metabolic and psychological effects of obesity.  Was counseled on the health benefits of losing 5%-10% of total body weight.  Was counseled on our cognitive behavorial therapy program, lead by our bariatric psychologist, who focuses on emotional eating and creating positive behavorial change.  Was counseled on bariatric pharmacotherapy and how this may be used as an adjunct in their weight management   Eric Nunez appears to be in the action stage of change and states they are ready to start intensive lifestyle modifications and  behavioral modifications.  It was recommended that he follow up in the next 1-2 weeks to review the above steps, and to continue with treatment of their chronic disease state of obesity   FOR OTHER CONDITIONS RELATED TO THE DISEASE OF OBESITY:   Subclinical hypothyroidism Assessment & Plan: Relevant medication: none. Most recent TSH of 7.06 on 06/01/2024 was consistent with sub-clinical hypothyroidism.   - Dx was reviewed with the patient and education was provided. - Pt aware of disease state. - Will defer further care to PCP and/or specialists.     Prediabetes Assessment & Plan: He has a history of Pre-DM managed with diet and life-style interventions. His hemoglobin A1c was 5.8 on 02/05/2024. Most recent A1c was 5.5 on 06/01/2024.   - Pt aware of disease state and risk of progression  - He would benefit from a high protein low-carb meal plan.  - Will check fasting blood glucose and insulin levels with intake labs at next OV if he decides to joint our program.     Mixed hyperlipidemia Assessment & Plan: He has a history of mixed hyperlipidemia managed with diet and life-style interventions. Most recent lipid panel on 06/01/2024 demonstrated low HDL at 37 and elevated LDL at 145.   -  Pt would benefit from a diet low in saturated and trans fats.  - Increasing exercise to eventual goal of 300-450  minutes of moderate intensity aerobic activity a week and strengthening exercises 2-3 times per week can raise HDL.    Attestations:   I, Special Puri, acting as a Stage manager for [employer], DO., have compiled all relevant documentation for today's office visit on behalf of Eric Jenkins, DO, while in the presence of [employer], DO.  I have spent 42 minutes in the care of the patient today including 32 minutes on face to face counseling of the patient on the disease of obesity and what our program can do for their medical conditions as well as in preventing future  diseases. I discussed the importance of comprehensive care in the treatment of obesity including mental well being and physical activity.   I have reviewed the above documentation for accuracy and completeness, and I agree with the above. Eric JINNY Nunez, D.O.  The 21st Century Cures Act was signed into law in 2016 which includes the topic of electronic health records.  This provides immediate access to information in MyChart. This includes consultation notes, operative notes, office notes, lab results and pathology reports.  If you have any questions about what you read please let us  know at your next visit so we can discuss your concerns and take corrective action if need be.  We are right here with you!

## 2024-08-15 ENCOUNTER — Encounter: Payer: Self-pay | Admitting: Nurse Practitioner

## 2024-08-22 ENCOUNTER — Ambulatory Visit (HOSPITAL_COMMUNITY)
Admission: EM | Admit: 2024-08-22 | Discharge: 2024-08-22 | Disposition: A | Discharge status: Home / Self Care | Attending: Internal Medicine | Admitting: Internal Medicine

## 2024-08-22 ENCOUNTER — Encounter (HOSPITAL_COMMUNITY): Payer: Self-pay

## 2024-08-22 DIAGNOSIS — H6122 Impacted cerumen, left ear: Secondary | ICD-10-CM | POA: Diagnosis not present

## 2024-08-22 NOTE — ED Triage Notes (Addendum)
 Patient reports that he has had water in his left ear since 08/09/24, Patient states he went to his PCP on 08/09/24 and was prescribed an antibiotic. Patient states the antibiotics did nothing for him. Patient reiterated that he had water in his left ear. Patient states he is deaf in the right ear.   Patient states he has tried H2O2, vinegar, and alcohol to get the water out.

## 2024-08-22 NOTE — Discharge Instructions (Signed)
 You need to follow up with ear nose and doctor.

## 2024-08-22 NOTE — ED Provider Notes (Signed)
 [facility]    CSN: [id number] Arrival date & time: 08/22/24  1853      History   Chief Complaint Chief Complaint  Patient presents with   ear issue    HPI Eric Nunez is a 53 y.o. male presents with  feeling he has water in his L ear since 08/19 and saw his PCP who prescribed him antibiotics drops, but has not helped. Her the provider exasm notes his TM was injected and erythematous. Pt states his PCP did ear lavage and was unsuccessful from getting the wax out and told him to FU in one week which would have been tomorrow, but he thought maybe he could it have it flushed here. He believes he needs to see ENT to have it suctioned. He denies drainage or pain from L ear.     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 Authorizing Provider  HYDROcodone-acetaminophen  (NORCO/VICODIN) 5-325 MG tablet Take 1 tablet by mouth every 6 (six) hours as needed for moderate pain.    [provider]  Multiple Vitamin (MULTIVITAMIN WITH MINERALS) TABS tablet Take 1 tablet by mouth daily.    [provider]    Family History Family History  Problem Relation Age of Onset   Diabetes Mother    Diabetes Father     Social History Social History   Tobacco Use   Smoking status: Never   Smokeless tobacco: Never  Vaping  Use   Vaping status: Never Used  Substance Use Topics   Alcohol use: Yes    Comment: rare   Drug use: Never     Allergies   Patient has no known allergies.   Review of Systems Review of Systems   Physical Exam Triage Vital Signs ED Triage Vitals [08/22/24 1947]  Encounter Vitals Group     BP 121/81     Girls Systolic BP Percentile      Girls Diastolic BP Percentile      Boys Systolic BP Percentile      Boys Diastolic BP Percentile      Pulse Rate 90     Resp 14     Temp 99.2 F (37.3 C)     Temp Source Oral     SpO2 93 %     Weight      Height      Head Circumference      Peak Flow      Pain Score 0     Pain Loc      Pain Education      Exclude from Growth Chart    No data found.  Updated Vital Signs BP 121/81 (BP Location: Right Arm)   Pulse 90   Temp 99.2 F (37.3 C) (Oral)   Resp 14  SpO2 93%   Visual Acuity Right Eye Distance:   Left Eye Distance:   Bilateral Distance:    Right Eye Near:   Left Eye Near:    Bilateral Near:     Physical Exam Vitals and nursing note reviewed.  Constitutional:      General: He is not in acute distress.    Appearance: He is obese. He is not ill-appearing.  HENT:     Left Ear: External ear normal. There is impacted cerumen.  Eyes:     General: No scleral icterus.    Conjunctiva/sclera: Conjunctivae normal.  Pulmonary:     Effort: Pulmonary effort is normal.  Musculoskeletal:        General: Normal range of motion.     Cervical back: Neck supple.  Lymphadenopathy:     Cervical: No cervical adenopathy.  Skin:    General: Skin is warm and dry.  Neurological:     Mental Status: He is alert and oriented to person, place, and time.     Gait: Gait normal.  Psychiatric:        Mood and Affect: Mood normal.        Behavior: Behavior normal.        Thought Content: Thought content normal.        Judgment: Judgment normal.      UC Treatments / Results  Labs (all labs ordered are listed, but only  abnormal results are displayed) Labs Reviewed - No data to display  EKG   Radiology No results found.  Procedures Procedures (including critical care time)  Medications Ordered in UC Medications - No data to display  Initial Impression / Assessment and Plan / UC Course  I have reviewed the triage vital signs and the nursing notes.  L cerumen impaction causing decreased hearing  We attempted lavage, but very little came out. The wax is deep and I did not want try to remove it with a curette. Needs to FU with ENT.  Final Clinical Impressions(s) / UC Diagnoses   Final diagnoses:  Impacted cerumen of left ear     Discharge Instructions      You need to follow up with ear nose and doctor.      ED Prescriptions   None    PDMP not reviewed this encounter.   Lindi Carter, PA-C 08/22/24 2052

## 2024-10-24 ENCOUNTER — Encounter: Payer: Self-pay | Admitting: Radiology
